# Patient Record
Sex: Female | Born: 1960 | Race: White | Hispanic: No | Marital: Married | Smoking: Never smoker
Health system: Southern US, Community
[De-identification: ages and names within clinical notes are randomized; demographics above are authoritative.]

## PROBLEM LIST (undated history)

## (undated) DIAGNOSIS — R51 Headache: Secondary | ICD-10-CM

## (undated) DIAGNOSIS — S0300XA Dislocation of jaw, unspecified side, initial encounter: Secondary | ICD-10-CM

## (undated) DIAGNOSIS — J45909 Unspecified asthma, uncomplicated: Secondary | ICD-10-CM

## (undated) DIAGNOSIS — R519 Headache, unspecified: Secondary | ICD-10-CM

## (undated) HISTORY — DX: Dislocation of jaw, unspecified side, initial encounter: S03.00XA

## (undated) HISTORY — DX: Headache, unspecified: R51.9

## (undated) HISTORY — PX: ABDOMINAL HYSTERECTOMY: SHX81

## (undated) HISTORY — DX: Headache: R51

---

## 1988-11-14 HISTORY — PX: GANGLION CYST EXCISION: SHX1691

## 1999-08-19 ENCOUNTER — Other Ambulatory Visit: Admission: RE | Admit: 1999-08-19 | Discharge: 1999-08-19 | Payer: Self-pay | Admitting: Family Medicine

## 2000-02-07 ENCOUNTER — Encounter: Payer: Self-pay | Admitting: Family Medicine

## 2000-02-07 ENCOUNTER — Encounter: Admission: RE | Admit: 2000-02-07 | Discharge: 2000-02-07 | Payer: Self-pay | Admitting: Family Medicine

## 2000-02-17 ENCOUNTER — Encounter: Payer: Self-pay | Admitting: Family Medicine

## 2000-02-17 ENCOUNTER — Encounter: Admission: RE | Admit: 2000-02-17 | Discharge: 2000-02-17 | Payer: Self-pay | Admitting: Family Medicine

## 2000-10-02 ENCOUNTER — Encounter (INDEPENDENT_AMBULATORY_CARE_PROVIDER_SITE_OTHER): Payer: Self-pay | Admitting: Specialist

## 2000-10-02 ENCOUNTER — Ambulatory Visit (HOSPITAL_COMMUNITY): Admission: RE | Admit: 2000-10-02 | Discharge: 2000-10-02 | Payer: Self-pay | Admitting: Gastroenterology

## 2001-04-17 ENCOUNTER — Encounter: Payer: Self-pay | Admitting: Family Medicine

## 2001-04-17 ENCOUNTER — Encounter: Admission: RE | Admit: 2001-04-17 | Discharge: 2001-04-17 | Payer: Self-pay | Admitting: Family Medicine

## 2001-07-17 ENCOUNTER — Other Ambulatory Visit: Admission: RE | Admit: 2001-07-17 | Discharge: 2001-07-17 | Payer: Self-pay | Admitting: *Deleted

## 2002-04-26 ENCOUNTER — Encounter: Admission: RE | Admit: 2002-04-26 | Discharge: 2002-04-26 | Payer: Self-pay | Admitting: Family Medicine

## 2002-04-26 ENCOUNTER — Encounter: Payer: Self-pay | Admitting: Family Medicine

## 2003-05-06 ENCOUNTER — Encounter: Payer: Self-pay | Admitting: Family Medicine

## 2003-05-06 ENCOUNTER — Encounter: Admission: RE | Admit: 2003-05-06 | Discharge: 2003-05-06 | Payer: Self-pay | Admitting: Family Medicine

## 2004-04-02 ENCOUNTER — Other Ambulatory Visit: Admission: RE | Admit: 2004-04-02 | Discharge: 2004-04-02 | Payer: Self-pay | Admitting: Family Medicine

## 2005-04-22 ENCOUNTER — Other Ambulatory Visit: Admission: RE | Admit: 2005-04-22 | Discharge: 2005-04-22 | Payer: Self-pay | Admitting: Family Medicine

## 2005-05-24 ENCOUNTER — Encounter: Admission: RE | Admit: 2005-05-24 | Discharge: 2005-05-24 | Payer: Self-pay | Admitting: Family Medicine

## 2006-04-28 ENCOUNTER — Other Ambulatory Visit: Admission: RE | Admit: 2006-04-28 | Discharge: 2006-04-28 | Payer: Self-pay | Admitting: Family Medicine

## 2006-09-26 ENCOUNTER — Encounter: Admission: RE | Admit: 2006-09-26 | Discharge: 2006-09-26 | Payer: Self-pay | Admitting: Family Medicine

## 2007-05-02 ENCOUNTER — Other Ambulatory Visit: Admission: RE | Admit: 2007-05-02 | Discharge: 2007-05-02 | Payer: Self-pay | Admitting: Family Medicine

## 2007-08-06 ENCOUNTER — Other Ambulatory Visit: Admission: RE | Admit: 2007-08-06 | Discharge: 2007-08-06 | Payer: Self-pay | Admitting: Family Medicine

## 2007-10-01 ENCOUNTER — Encounter: Admission: RE | Admit: 2007-10-01 | Discharge: 2007-10-01 | Payer: Self-pay | Admitting: Family Medicine

## 2007-12-12 ENCOUNTER — Other Ambulatory Visit: Admission: RE | Admit: 2007-12-12 | Discharge: 2007-12-12 | Payer: Self-pay | Admitting: Family Medicine

## 2008-10-14 ENCOUNTER — Encounter: Admission: RE | Admit: 2008-10-14 | Discharge: 2008-10-14 | Payer: Self-pay | Admitting: Family Medicine

## 2008-12-17 ENCOUNTER — Other Ambulatory Visit: Admission: RE | Admit: 2008-12-17 | Discharge: 2008-12-17 | Payer: Self-pay | Admitting: Family Medicine

## 2009-11-24 ENCOUNTER — Encounter: Admission: RE | Admit: 2009-11-24 | Discharge: 2009-11-24 | Payer: Self-pay | Admitting: Family Medicine

## 2010-06-17 ENCOUNTER — Other Ambulatory Visit: Admission: RE | Admit: 2010-06-17 | Discharge: 2010-06-17 | Payer: Self-pay | Admitting: Family Medicine

## 2010-11-25 ENCOUNTER — Encounter
Admission: RE | Admit: 2010-11-25 | Discharge: 2010-11-25 | Payer: Self-pay | Source: Home / Self Care | Attending: Family Medicine | Admitting: Family Medicine

## 2011-02-14 ENCOUNTER — Ambulatory Visit
Admission: RE | Admit: 2011-02-14 | Discharge: 2011-02-14 | Disposition: A | Discharge status: Home / Self Care | Payer: 59 | Source: Ambulatory Visit | Attending: Family Medicine | Admitting: Family Medicine

## 2011-02-14 ENCOUNTER — Other Ambulatory Visit: Payer: Self-pay | Admitting: Family Medicine

## 2011-02-14 DIAGNOSIS — R0989 Other specified symptoms and signs involving the circulatory and respiratory systems: Secondary | ICD-10-CM

## 2011-02-14 DIAGNOSIS — R05 Cough: Secondary | ICD-10-CM

## 2011-04-01 NOTE — Procedures (Signed)
[place]. [facility]  Patient:    Alexa Beard, Alexa Beard                      MRN: [id number] Proc. Date: 10/02/00 Adm. Date:  [id number] Attending:  Rich Brave CC:         Dyanne Carrel, M.D.  Thad Ranger, M.D   Procedure Report  PROCEDURE:  Colonoscopy with biopsies.  INDICATION:  A 50 year old female with intermittent postprandial diarrhea and family history of colon polyps in her father.  FINDINGS:  Normal exam to the terminal ileum except for diminutive sigmoid polyp.  DESCRIPTION OF PROCEDURE:  The nature, purpose, and risks of the procedure had been discussed with the patient, who provided written consent.  The Olympus adjustable-tension video colonoscope was advanced to the terminal ileum without significant difficulty, turning the patient into the supine position to facilitate passage.  The terminal ileum had a normal appearance and was biopsied a few times. Pullback was then performed.  The quality of the prep was excellent, and it was felt that all areas were well seen.  There was a diminutive 2 mm hyperplastic-appearing polyp at 35 cm, which I biopsied x 1.  Random mucosal biopsies were also obtained along the length of the colon. There was perhaps some slight edema of the distal rectal mucosa with increased friability, but this is felt probably to be more prep-related.  There was no overt granularity or exudate noted.  Retroflexion in the rectum prior to the rectal biopsies was unremarkable.  The patient tolerated this procedure well, and there were no apparent complications.  IMPRESSION: 1. Essentially normal colonoscopy, without source of postprandial diarrhea    endoscopically evident. 2. Tiny sigmoid polyp.  PLAN:  Await pathology on biopsies. DD:  10/02/00 TD:  10/02/00 Job: [id number]

## 2011-04-01 NOTE — Procedures (Signed)
[place]. [facility]  Patient:    Alexa Beard, Alexa Beard                      MRN: [id number] Proc. Date: 10/02/00 Adm. Date:  [id number] Attending:  Rich Brave CC:         Dyanne Carrel, M.D.  Thad Ranger, M.D   Procedure Report  PROCEDURE:  Upper endoscopy with biopsies.  INDICATION:  This is a 50 year old female with laryngopharyngeal reflux-type symptoms and also intermittent diarrhea.  FINDINGS:  Possible "scalloping" of duodenal rings, suggesting possible celiac disease.  Otherwise unremarkable exam.  DESCRIPTION OF PROCEDURE:  The nature, purpose, and risks of the procedure had been discussed with the patient, who provided written consent.  Sedation was fentanyl 80 mcg and Versed 8 mg IV without arrhythmias or desaturation.  The Olympus XQ-140 video small-caliber upper endoscope was passed under direct vision.  Careful exam of the vocal cords and larynx did not disclose any obvious or significant reflux-induced changes, in my opinion.  There might have been some slight thickening of the posterior commissure in the inter-arytenoid area.  The esophagus was entered under direct vision without difficulty and had entirely normal mucosa, without evidence of reflux esophagitis, Barretts esophagus, varices, infection, or neoplasia.  No ring, stricture, or definite hiatal hernia was appreciated.  The stomach was entered.  Retroflex viewing showed at most just a very minimal hiatal hernia.  There was no significant residual, and the gastric mucosa was normal, without evidence of gastritis, erosions, ulcers, polyps, or masses.  The pylorus and duodenal bulb looked normal, but the second portion of the duodenum had some slight scalloping of the duodenal rings, the significance of which is unclear, but this can be a finding in celiac disease, so, in view of the patients history of postprandial diarrhea, several biopsies were  obtained from the duodenum prior to removal of the scope.  The patient tolerated this procedure well, and there were no apparent complications.  IMPRESSION: 1. No obvious reflux-related changes. 2. Possible duodenal abnormality, pathology pending.  PLAN: 1. Await pathology on biopsies. 2. Consider 24-hour pH monitoring and/or ENT evaluation for further evaluation    of possible laryngopharyngeal reflux. 3. Continue intensive PPI therapy for now. DD:  10/02/00 TD:  10/02/00 Job: [id number]

## 2011-06-22 ENCOUNTER — Other Ambulatory Visit (HOSPITAL_COMMUNITY)
Admission: RE | Admit: 2011-06-22 | Discharge: 2011-06-22 | Disposition: A | Discharge status: Home / Self Care | Payer: 59 | Source: Ambulatory Visit | Attending: Family Medicine | Admitting: Family Medicine

## 2011-06-22 ENCOUNTER — Other Ambulatory Visit: Payer: Self-pay | Admitting: Family Medicine

## 2011-06-22 DIAGNOSIS — Z124 Encounter for screening for malignant neoplasm of cervix: Secondary | ICD-10-CM | POA: Insufficient documentation

## 2011-11-16 ENCOUNTER — Other Ambulatory Visit: Payer: Self-pay | Admitting: Family Medicine

## 2011-11-16 DIAGNOSIS — Z1231 Encounter for screening mammogram for malignant neoplasm of breast: Secondary | ICD-10-CM

## 2011-11-28 ENCOUNTER — Ambulatory Visit
Admission: RE | Admit: 2011-11-28 | Discharge: 2011-11-28 | Disposition: A | Discharge status: Home / Self Care | Payer: 59 | Source: Ambulatory Visit | Attending: Family Medicine | Admitting: Family Medicine

## 2011-11-28 DIAGNOSIS — Z1231 Encounter for screening mammogram for malignant neoplasm of breast: Secondary | ICD-10-CM

## 2012-07-20 ENCOUNTER — Other Ambulatory Visit: Payer: Self-pay | Admitting: Family Medicine

## 2012-07-20 DIAGNOSIS — R2681 Unsteadiness on feet: Secondary | ICD-10-CM

## 2012-07-20 DIAGNOSIS — R209 Unspecified disturbances of skin sensation: Secondary | ICD-10-CM

## 2012-07-20 DIAGNOSIS — R51 Headache: Secondary | ICD-10-CM

## 2012-07-23 ENCOUNTER — Other Ambulatory Visit: Payer: 59

## 2012-07-25 ENCOUNTER — Ambulatory Visit
Admission: RE | Admit: 2012-07-25 | Discharge: 2012-07-25 | Disposition: A | Discharge status: Home / Self Care | Payer: 59 | Source: Ambulatory Visit | Attending: Family Medicine | Admitting: Family Medicine

## 2012-07-25 DIAGNOSIS — R2681 Unsteadiness on feet: Secondary | ICD-10-CM

## 2012-07-25 DIAGNOSIS — R209 Unspecified disturbances of skin sensation: Secondary | ICD-10-CM

## 2012-07-25 DIAGNOSIS — R51 Headache: Secondary | ICD-10-CM

## 2012-07-25 MED ORDER — GADOBENATE DIMEGLUMINE 529 MG/ML IV SOLN
14.0000 mL | Freq: Once | INTRAVENOUS | Status: AC | PRN
  Administered 2012-07-25: 14 mL via INTRAVENOUS

## 2012-10-19 ENCOUNTER — Other Ambulatory Visit: Payer: Self-pay | Admitting: Family Medicine

## 2012-10-19 DIAGNOSIS — Z1231 Encounter for screening mammogram for malignant neoplasm of breast: Secondary | ICD-10-CM

## 2012-11-28 ENCOUNTER — Ambulatory Visit
Admission: RE | Admit: 2012-11-28 | Discharge: 2012-11-28 | Disposition: A | Discharge status: Home / Self Care | Payer: 59 | Source: Ambulatory Visit | Attending: Family Medicine | Admitting: Family Medicine

## 2012-11-28 DIAGNOSIS — Z1231 Encounter for screening mammogram for malignant neoplasm of breast: Secondary | ICD-10-CM

## 2013-04-10 ENCOUNTER — Other Ambulatory Visit: Payer: Self-pay | Admitting: Diagnostic Neuroimaging

## 2013-05-29 ENCOUNTER — Ambulatory Visit: Payer: Self-pay | Admitting: Diagnostic Neuroimaging

## 2013-06-03 ENCOUNTER — Ambulatory Visit: Payer: Self-pay | Admitting: Nurse Practitioner

## 2013-06-05 ENCOUNTER — Ambulatory Visit (INDEPENDENT_AMBULATORY_CARE_PROVIDER_SITE_OTHER): Payer: 59 | Admitting: Nurse Practitioner

## 2013-06-05 ENCOUNTER — Encounter: Payer: Self-pay | Admitting: Nurse Practitioner

## 2013-06-05 VITALS — BP 142/79 | HR 60 | Temp 98.5°F | Ht 62.75 in | Wt 161.0 lb

## 2013-06-05 DIAGNOSIS — G43909 Migraine, unspecified, not intractable, without status migrainosus: Secondary | ICD-10-CM

## 2013-06-05 MED ORDER — SUMATRIPTAN SUCCINATE 100 MG PO TABS: 100.0000 mg | ORAL_TABLET | Freq: Every day | ORAL | Status: DC

## 2013-06-05 MED ORDER — PROPRANOLOL HCL ER 60 MG PO CP24: ORAL_CAPSULE | ORAL | Status: DC

## 2013-06-05 NOTE — Progress Notes (Signed)
[facility]  PATIENT: Alexa Beard DOB: 1961-07-13   HISTORY FROM: patient REASON FOR VISIT: 6 month follow up for Migraine  HISTORY OF PRESENT ILLNESS:  Update 51/23/2014: Since last visit, no changes, still having 1 migraine-type headache per month with approximately 4 milder type stress headaches per month.  She states her migraines are sometimes accompanied with aura of spots in her vision and increased sensitivity to light. She cannot identify any headache triggers except stress. She continues to take propranolol daily and Imitrex when necessary for acute headache.  For milder stress-type headache she takes extra strength Tylenol.  Still with intermittent numbness, confusion, short-term memory problems and word finding difficulties.  Update 11/27/2012: Since last visit, now having one headache per month. Much improved. Still with intermittent numbness, confusion, memory. Some intermittent left neck and trapezius pain.  PRIOR HPI (VRP): 08/23/2012: 52 year old left-handed female with history of migraine, anxiety, here for evaluation of new onset headaches and abnormal MRI.  Patient has long history of migraine headaches typically with unilateral throbbing severe pain, seeing spots or blind spots, with nausea and sensitivity light and sound. Previously she was taking Imitrex as needed for migraine control. Over the past one year she developed a new type of headache, global sudden severe headaches with intermittent nausea and vision changes. For the past 6 months she's had intermittent numbness in her bilateral fingertips, dropping things. Showed similar symptoms 10 years ago, diagnosed by Dr. love, with MRI of the cervical spine and EMG. 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 Patient also complains of mild intermittent progressive short-term memory loss, word finding difficulties. She also reports poor sleep. Husband reports mild snoring. She is restless  with mild anxiety.  REVIEW OF SYSTEMS: Full 14 system review of systems performed and notable only for: constitutional: Fatigue  cardiovascular: N/A respiratory: Snoring endocrine: Feeling hot ear/nose/throat: Ringing in ears, spinning sensation, trouble swallowing  musculoskeletal: Joint pain, aching muscles skin: N/A genitourinary: N/A Gastrointestinal: Blood in stool, diarrhea (last colonoscopy November 2013) allergy/immunology: Allergies neurological: Memory loss, confusion, headache, numbness, weakness, difficulty swallowing, dizziness sleep: Insomnia, snoring, restless legs psychiatric: Anxiety, decreased energy, racing thoughts   ALLERGIES: Allergies  Allergen Reactions  . Ampicillin Hives    HOME MEDICATIONS: Outpatient Prescriptions Prior to Visit  Medication Sig Dispense Refill  . propranolol ER (INDERAL LA) 60 MG 24 hr capsule TAKE 1 CAPSULE BY MOUTH DAILY  90 capsule  1   No facility-administered medications prior to visit.    PAST MEDICAL HISTORY: No past medical history on file.  PAST SURGICAL HISTORY: No past surgical history on file.  FAMILY HISTORY: No family history on file.  SOCIAL HISTORY: History   Social History  . Marital Status: Married    Spouse Name: N/A    Number of Children: N/A  . Years of Education: N/A   Occupational History  . Not on file.   Social History Main Topics  . Smoking status: Not on file  . Smokeless tobacco: Not on file  . Alcohol Use: Not on file  . Drug Use: Not on file  . Sexually Active: Not on file   Other Topics Concern  . Not on file   Social History Narrative  . No narrative on file     PHYSICAL EXAM  Filed Vitals:   06/05/13 1522  BP: 142/79  Pulse: 60  Temp: 98.5 F (36.9 C)  TempSrc: Oral  Weight: 161 lb (73.029 kg)   There is no  height on file to calculate BMI.  Generalized: In no acute distress, well-developed, well-nourished Caucasian  Female.  Neck: Supple, no carotid bruits     Cardiac: Regular rate rhythm, no murmur   Pulmonary: Clear to auscultation bilaterally   Musculoskeletal: No deformity   Neurological examination   Mentation: Alert oriented to time, place, history taking, language fluent, and causual conversation  Cranial nerve II-XII: Pupils were equal round reactive to light extraocular movements were full, visual field were full on confrontational test. facial sensation and strength were normal. hearing was intact to finger rubbing bilaterally. Uvula tongue midline. head turning and shoulder shrug and were normal and symmetric.Tongue protrusion into cheek strength was normal. MOTOR: normal bulk and tone, full strength in the BUE, BLE, fine finger movements normal, no pronator drift SENSORY: normal and symmetric to light touch, pinprick, temperature, vibration and proprioception COORDINATION: finger-nose-finger, heel-to-shin bilaterally, there was no truncal ataxia REFLEXES: Brachioradialis 2/2, biceps 2/2, triceps 2/2, patellar 2/2, Achilles 2/2, plantar responses were flexor bilaterally. GAIT/STATION: Rising up from seated position without assistance, normal stance, without trunk ataxia, moderate stride, good arm swing, smooth turning, able to perform tiptoe, and heel walking without difficulty.    DIAGNOSTIC DATA (LABS, IMAGING, TESTING) - I reviewed patient records, labs, notes, testing and imaging myself where available.  07/25/12 MRI brain: few non-specific foci of gliosis.  ASSESSMENT AND PLAN 52 year old Caucasian female with history of anxiety and Migraines here for routine follow up of Migraine headaches.  PLAN: 1. Continue Sumatriptan Succinate 100 mg tabs prn 2. Continue Propranolol HCl ER 60 mg daily. 3. Headache education reviewed (moderation with etoh, sleep, caffeine, and eating.) 4. Follow up in 6 months.   Hazael Olveda NP-C 06/05/2013, 3:26 PM  [facility] [address] [place], [place]  [address] [phone]

## 2013-06-05 NOTE — Patient Instructions (Addendum)
Continue current medications    Follow up in 6 months

## 2013-06-12 NOTE — Progress Notes (Signed)
I reviewed note and agree with plan.   Suanne Marker, MD 06/12/2013, 6:33 PM Certified in Neurology, Neurophysiology and Neuroimaging  [facility] [address] [place], [place] [address] [phone]

## 2013-10-29 ENCOUNTER — Other Ambulatory Visit: Payer: Self-pay

## 2013-10-29 DIAGNOSIS — Z1231 Encounter for screening mammogram for malignant neoplasm of breast: Secondary | ICD-10-CM

## 2013-12-03 ENCOUNTER — Ambulatory Visit: Payer: 59

## 2013-12-05 ENCOUNTER — Encounter: Payer: Self-pay | Admitting: Nurse Practitioner

## 2013-12-05 ENCOUNTER — Ambulatory Visit (INDEPENDENT_AMBULATORY_CARE_PROVIDER_SITE_OTHER): Payer: 59 | Admitting: Nurse Practitioner

## 2013-12-05 ENCOUNTER — Encounter (INDEPENDENT_AMBULATORY_CARE_PROVIDER_SITE_OTHER): Payer: Self-pay

## 2013-12-05 VITALS — BP 142/82 | HR 74 | Ht 62.75 in | Wt 167.0 lb

## 2013-12-05 DIAGNOSIS — G43909 Migraine, unspecified, not intractable, without status migrainosus: Secondary | ICD-10-CM

## 2013-12-05 MED ORDER — TOPIRAMATE 50 MG PO TABS: 50.0000 mg | ORAL_TABLET | Freq: Every day | ORAL | Status: DC

## 2013-12-05 MED ORDER — SUMATRIPTAN SUCCINATE 100 MG PO TABS: 100.0000 mg | ORAL_TABLET | Freq: Every day | ORAL | Status: DC

## 2013-12-05 NOTE — Patient Instructions (Addendum)
  PLAN:  1. Continue Sumatriptan Succinate 100 mg tabs prn  2. DiscontinuePropranolol HCl ER 60 mg daily.  3. Start Topirimate 50 mg, take 1/2 tablet for 1 week, then increase to 1 tablet every night.  4. Follow up in 3 months.

## 2013-12-05 NOTE — Progress Notes (Signed)
PATIENT: Alexa Beard DOB: 02-03-1961   REASON FOR VISIT: follow up for Migraine HISTORY FROM: patient  HISTORY OF PRESENT ILLNESS: UPDATE 12/05/13 (LL):  Returns for follow up of Migraine, still having only 1-2 Migraines per month but is concerned over fatigue with Inderal LA.  Would like to try something else as preventative.  Update 06/05/2013 (LL): Since last visit, no changes, still having 1 migraine-type headache per month with approximately 4 milder type stress headaches per month. She states her migraines are sometimes accompanied with aura of spots in her vision and increased sensitivity to light. She cannot identify any headache triggers except stress. She continues to take propranolol daily and Imitrex when necessary for acute headache. For milder stress-type headache she takes extra strength Tylenol. Still with intermittent numbness, confusion, short-term memory problems and word finding difficulties.   Update 11/27/2012 (VRP): Since last visit, now having one headache per month. Much improved. Still with intermittent numbness, confusion, memory. Some intermittent left neck and trapezius pain.   PRIOR HPI (VRP): 08/23/2012: 53 year old left-handed female with history of migraine, anxiety, here for evaluation of new onset headaches and abnormal MRI.  Patient has long history of migraine headaches typically with unilateral throbbing severe pain, seeing spots or blind spots, with nausea and sensitivity light and sound. Previously she was taking Imitrex as needed for migraine control. Over the past one year she developed a new type of headache, global sudden severe headaches with intermittent nausea and vision changes. For the past 6 months she's had intermittent numbness in her bilateral fingertips, dropping things. Showed similar symptoms 10 years ago, diagnosed by Dr. love, with MRI of the cervical spine and EMG. 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 Patient also  complains of mild intermittent progressive short-term memory loss, word finding difficulties. She also reports poor sleep. Husband reports mild snoring. She is restless with mild anxiety.   REVIEW OF SYSTEMS: Full 14 system review of systems performed and notable only for:  constitutional: Fatigue  endocrine: Excessive thirst ear/nose/throat: Ringing in ears, neck pain, trouble swallowing  musculoskeletal: coordination problems   Gastrointestinal: Blood in stool, diarrhea (last colonoscopy November 2013)  neurological: Memory loss, headache, numbness, dizziness  sleep:snoring, daytime sleepiness Psychiatric: decreased concentration  ALLERGIES: Allergies  Allergen Reactions  . Ampicillin Hives  . Benzonatate Itching    HOME MEDICATIONS: Outpatient Prescriptions Prior to Visit  Medication Sig Dispense Refill  . ADVAIR DISKUS 250-50 MCG/DOSE AEPB Inhale 1 puff into the lungs as needed.      . diphenoxylate-atropine (LOMOTIL) 2.5-0.025 MG per tablet Take 1 tablet by mouth as needed for diarrhea or loose stools.      Marland Kitchen esomeprazole (NEXIUM) 10 MG packet Take 10 mg by mouth as needed.      Marland Kitchen FLUoxetine (PROZAC) 10 MG capsule Take 10 mg by mouth daily.      . montelukast (SINGULAIR) 10 MG tablet Take 1 tablet by mouth daily.      . temazepam (RESTORIL) 15 MG capsule Take 15 mg by mouth at bedtime.      . VENTOLIN HFA 108 (90 BASE) MCG/ACT inhaler Inhale 1 puff into the lungs as needed.      . propranolol ER (INDERAL LA) 60 MG 24 hr capsule TAKE 1 CAPSULE BY MOUTH DAILY  90 capsule  3  . SUMAtriptan (IMITREX) 100 MG tablet Take 1 tablet (100 mg total) by mouth daily.  10 tablet  6   No facility-administered medications prior to  visit.    PAST MEDICAL HISTORY: History reviewed. No pertinent past medical history.  PAST SURGICAL HISTORY: History reviewed. No pertinent past surgical history.  FAMILY HISTORY: Family History  Problem Relation Age of Onset  . COPD Father   . Dementia  Father     SOCIAL HISTORY: History   Social History  . Marital Status: Married    Spouse Name: Ortencia KickByron    Number of Children: 0  . Years of Education: HS   Occupational History  .      [employer]   Social History Main Topics  . Smoking status: Never Smoker   . Smokeless tobacco: Never Used  . Alcohol Use: 5 - 10 oz/week    10-20 drink(s) per week  . Drug Use: No  . Sexual Activity: Yes    Partners: Female   Other Topics Concern  . Not on file   Social History Narrative   Patient lives at home with family.   Caffeine Use: 1 cup daily     PHYSICAL EXAM  Filed Vitals:   12/05/13 1537  BP: 142/82  Pulse: 74  Height: 5' 2.75" (1.594 m)  Weight: 167 lb (75.751 kg)   Body mass index is 29.81 kg/(m^2).  Generalized: In no acute distress, well-developed, well-nourished Caucasian Female.  Neck: Supple, no carotid bruits  Cardiac: Regular rate rhythm, no murmur  Pulmonary: Clear to auscultation bilaterally  Musculoskeletal: No deformity   Neurological examination  Mentation: Alert oriented to time, place, history taking, language fluent, and causual conversation  Cranial nerve II-XII: Pupils were equal round reactive to light extraocular movements were full, visual field were full on confrontational test. facial sensation and strength were normal. hearing was intact to finger rubbing bilaterally. Uvula tongue midline. head turning and shoulder shrug and were normal and symmetric.Tongue protrusion into cheek strength was normal.  MOTOR: normal bulk and tone, full strength in the BUE, BLE, fine finger movements normal, no pronator drift  SENSORY: normal and symmetric to light touch, pinprick, temperature, vibration and proprioception  COORDINATION: finger-nose-finger, heel-to-shin bilaterally, there was no truncal ataxia  REFLEXES: Brachioradialis 2/2, biceps 2/2, triceps 2/2, patellar 2/2, Achilles 2/2, plantar responses were flexor bilaterally.  GAIT/STATION:  Rising up from seated position without assistance, normal stance, without trunk ataxia, moderate stride, good arm swing, smooth turning, able to perform tiptoe, and heel walking without difficulty.   DIAGNOSTIC DATA (LABS, IMAGING, TESTING) - I reviewed patient records, labs, notes, testing and imaging myself where available. 07/25/12 MRI brain: few non-specific foci of gliosis.   ASSESSMENT AND PLAN  53 year old Caucasian female with history of anxiety and Migraines here for routine follow up of Migraine headaches.  Migraines are stable, 1-2 per month, concerned about fatigue with Inderal.  PLAN:  1. Continue Sumatriptan Succinate 100 mg tabs prn  2. Discontinue Propranolol HCl ER 60 mg daily.  3. Start Topirimate 50 mg, take 1/2 tablet for 1 week, then increase to 1 tablet every night.  4. Follow up in 3 months.   No orders of the defined types were placed in this encounter.    Meds ordered this encounter  Medications  . SUMAtriptan (IMITREX) 100 MG tablet    Sig: Take 1 tablet (100 mg total) by mouth daily.    Dispense:  12 tablet    Refill:  5    Order Specific Question:  Supervising Provider    Answer:  Joycelyn SchmidPENUMALLI, VIKRAM R [[id number]]  . topiramate (TOPAMAX) 50 MG tablet  Sig: Take 1 tablet (50 mg total) by mouth daily.    Dispense:  30 tablet    Refill:  3    Order Specific Question:  Supervising Provider    Answer:  Suanne Marker [[id number]]   Return in about 3 months (around 03/05/2014).  Ronal Fear, MSN, NP-C 12/05/2013, 4:03 PM [facility] [address], [address] [place], [place] [address] [phone]  Note: This document was prepared with digital dictation and possible smart phrase technology. Any transcriptional errors that result from this process are unintentional.

## 2013-12-18 ENCOUNTER — Ambulatory Visit
Admission: RE | Admit: 2013-12-18 | Discharge: 2013-12-18 | Disposition: A | Discharge status: Home / Self Care | Payer: 59 | Source: Ambulatory Visit

## 2013-12-18 DIAGNOSIS — Z1231 Encounter for screening mammogram for malignant neoplasm of breast: Secondary | ICD-10-CM

## 2013-12-20 ENCOUNTER — Other Ambulatory Visit: Payer: Self-pay | Admitting: Family Medicine

## 2013-12-20 DIAGNOSIS — R928 Other abnormal and inconclusive findings on diagnostic imaging of breast: Secondary | ICD-10-CM

## 2013-12-27 ENCOUNTER — Ambulatory Visit
Admission: RE | Admit: 2013-12-27 | Discharge: 2013-12-27 | Disposition: A | Discharge status: Home / Self Care | Payer: 59 | Source: Ambulatory Visit | Attending: Family Medicine | Admitting: Family Medicine

## 2013-12-27 DIAGNOSIS — R928 Other abnormal and inconclusive findings on diagnostic imaging of breast: Secondary | ICD-10-CM

## 2014-01-13 IMAGING — MG MM DIGITAL SCREENING BILAT
4 series · 4 of 4 positions shown · non-contrast
Comparison: none

DG SCREEN MAMMOGRAM BILATERAL
Bilateral CC and MLO view(s) were taken.

DIGITAL SCREENING MAMMOGRAM WITH CAD:
There are scattered fibroglandular densities.  No masses or malignant type calcifications are 
identified.  Compared with prior studies.
Images were processed with CAD.

[R CC]
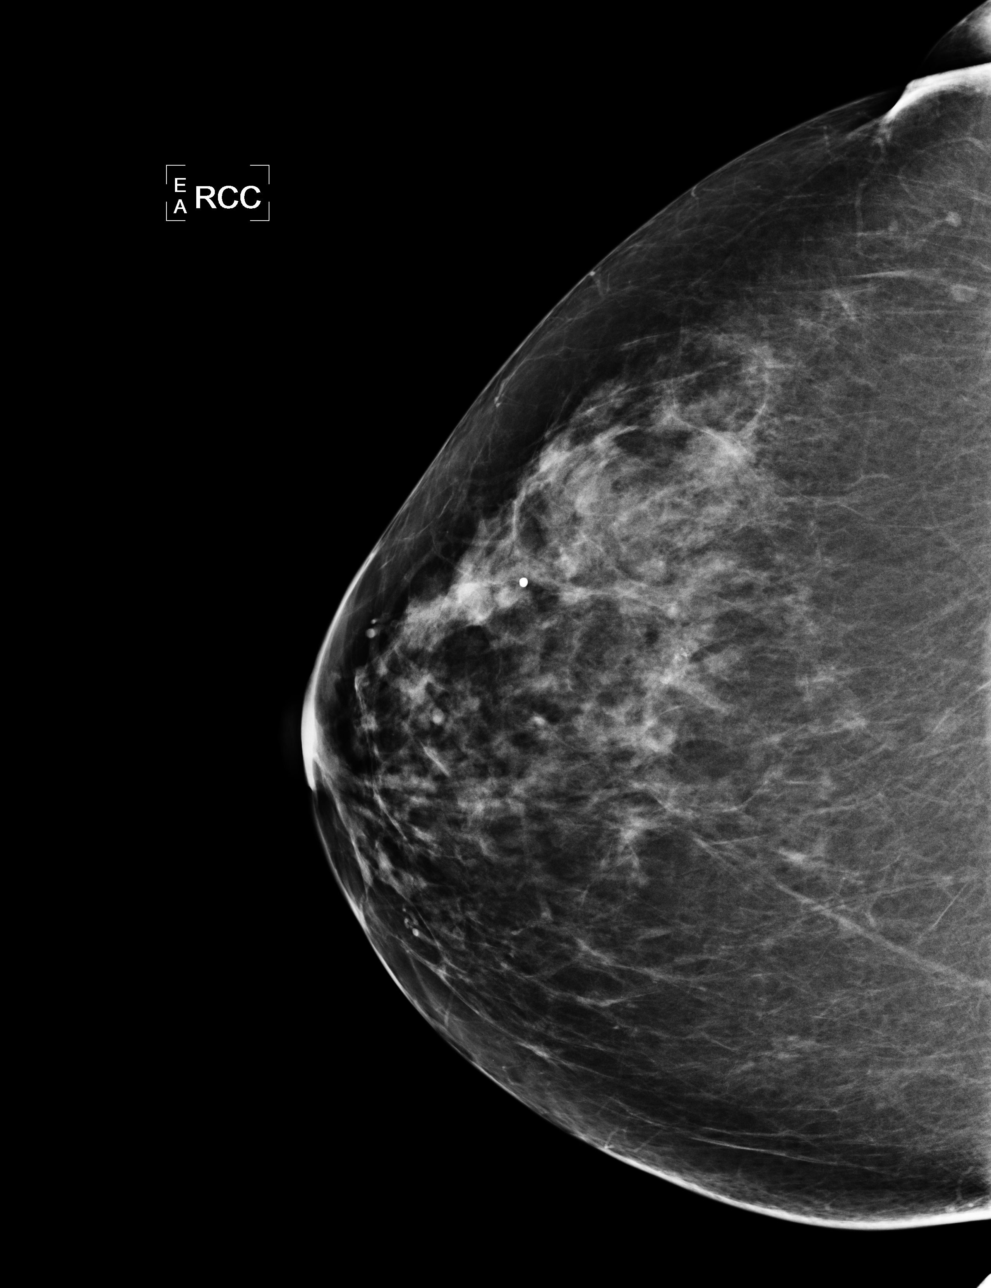

[L CC]
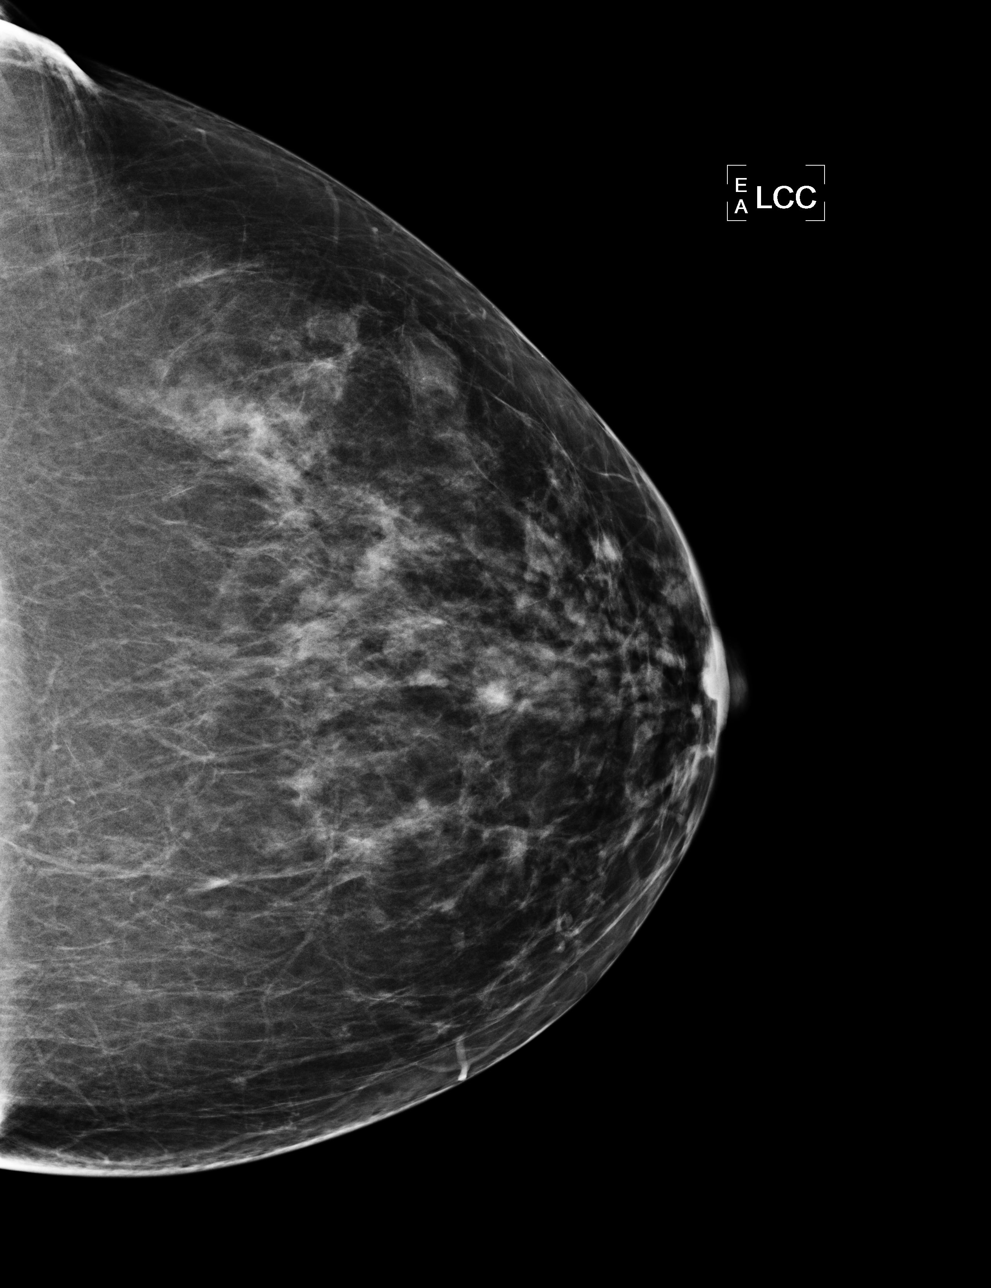

[L MLO]
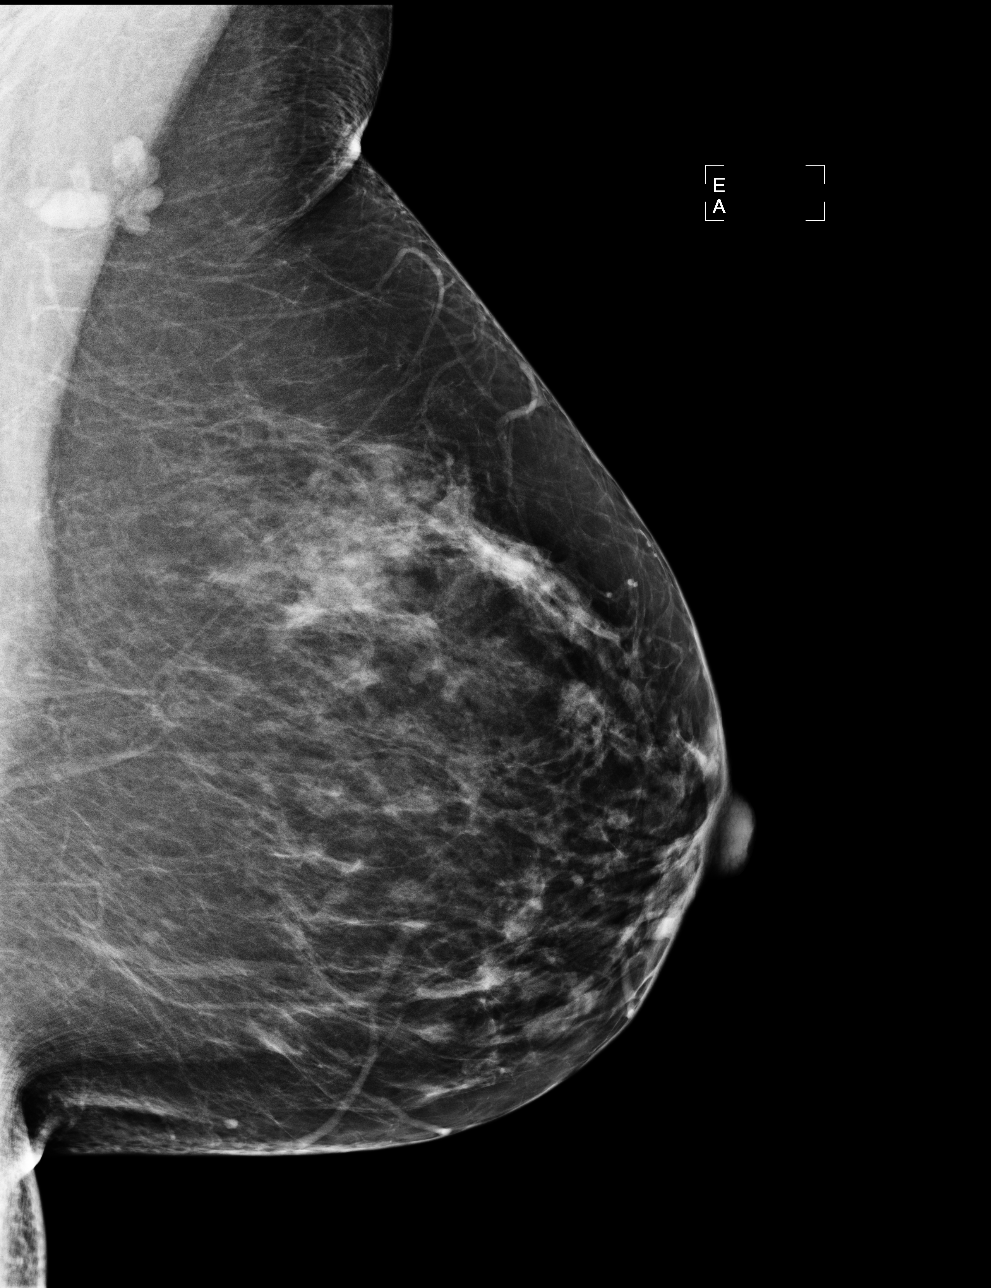

[R MLO]
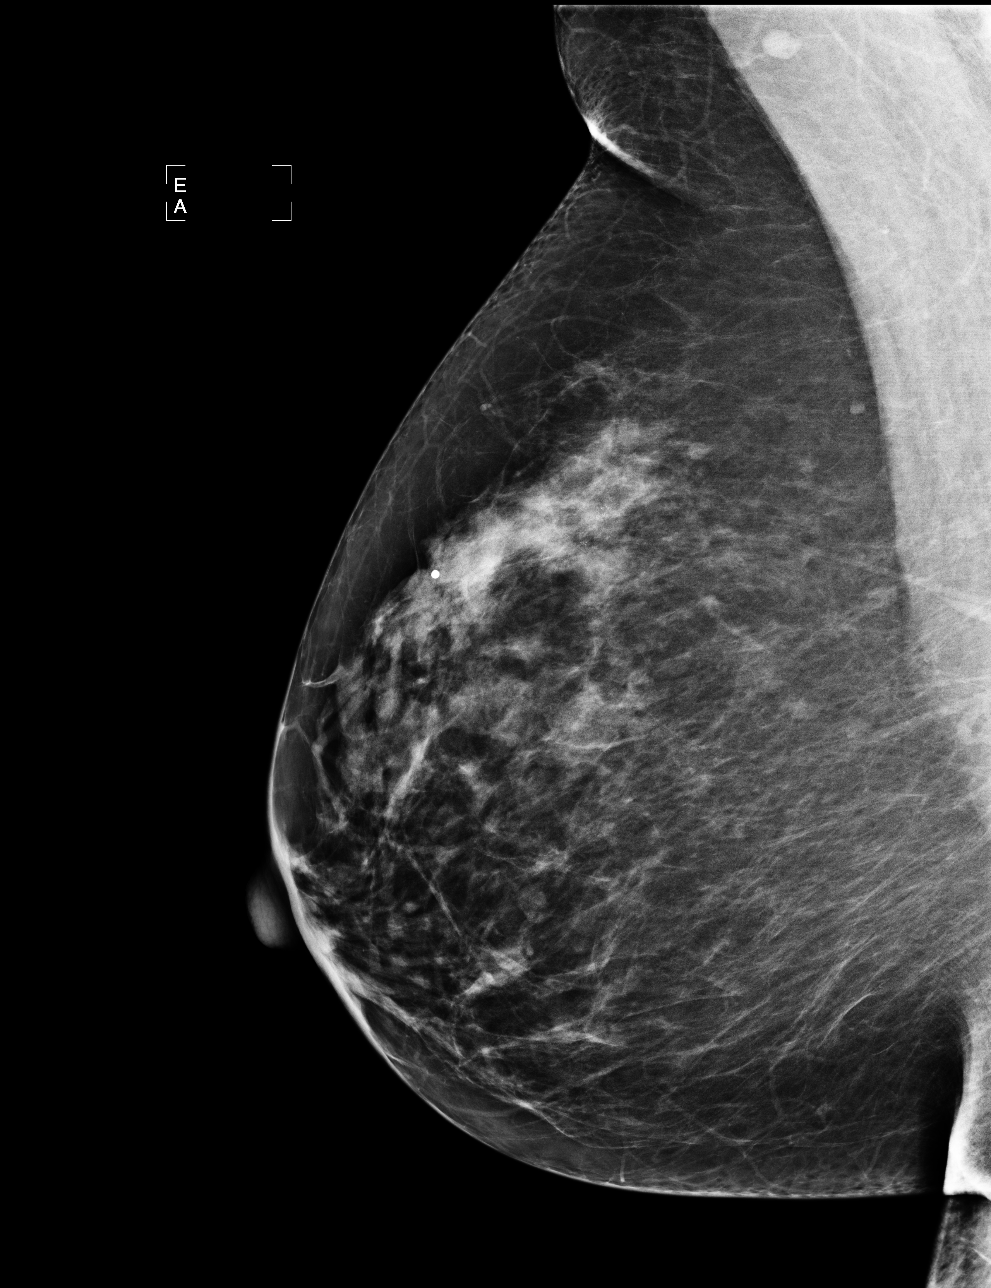

[4 of 4 positions shown; findings below may reference images not displayed]

IMPRESSION: No specific mammographic evidence of malignancy.  Next screening mammogram is recommended in one 
year.

A result letter of this screening mammogram will be mailed directly to the patient.

ASSESSMENT: Negative - BI-RADS 1

Screening mammogram in 1 year.
,

## 2014-01-22 ENCOUNTER — Other Ambulatory Visit: Payer: Self-pay

## 2014-01-22 MED ORDER — TOPIRAMATE 50 MG PO TABS: 50.0000 mg | ORAL_TABLET | Freq: Every day | ORAL | Status: DC

## 2014-03-10 ENCOUNTER — Ambulatory Visit: Payer: 59 | Admitting: Diagnostic Neuroimaging

## 2014-04-30 ENCOUNTER — Encounter: Payer: Self-pay | Admitting: Diagnostic Neuroimaging

## 2014-04-30 ENCOUNTER — Encounter (INDEPENDENT_AMBULATORY_CARE_PROVIDER_SITE_OTHER): Payer: Self-pay

## 2014-04-30 ENCOUNTER — Ambulatory Visit (INDEPENDENT_AMBULATORY_CARE_PROVIDER_SITE_OTHER): Payer: 59 | Admitting: Diagnostic Neuroimaging

## 2014-04-30 VITALS — BP 146/74 | HR 60 | Temp 97.2°F | Ht 62.0 in | Wt 163.0 lb

## 2014-04-30 DIAGNOSIS — G43909 Migraine, unspecified, not intractable, without status migrainosus: Secondary | ICD-10-CM

## 2014-04-30 DIAGNOSIS — M542 Cervicalgia: Secondary | ICD-10-CM

## 2014-04-30 NOTE — Patient Instructions (Signed)
Try physical therapy for neck pain.

## 2014-04-30 NOTE — Progress Notes (Signed)
PATIENT: Alexa Beard DOB: 08/09/1961   REASON FOR VISIT: follow up for Migraine HISTORY FROM: patient  HISTORY OF PRESENT ILLNESS:  UPDATE 04/30/14: Since last visit, doing well on TPX 50mg  daily. Still some fatigue, but improved off propranolol. Also with new issue: neck pain, radiating to left shoulder and arm. This is intermittent, with some pain, numbness, burning.   UPDATE 12/05/13 (LL):  Returns for follow up of Migraine, still having only 1-2 Migraines per month but is concerned over fatigue with Inderal LA.  Would like to try something else as preventative.  UPDATE 06/05/2013 (LL): Since last visit, no changes, still having 1 migraine-type headache per month with approximately 4 milder type stress headaches per month. She states her migraines are sometimes accompanied with aura of spots in her vision and increased sensitivity to light. She cannot identify any headache triggers except stress. She continues to take propranolol daily and Imitrex when necessary for acute headache. For milder stress-type headache she takes extra strength Tylenol. Still with intermittent numbness, confusion, short-term memory problems and word finding difficulties.   UPDATE 11/27/2012 (VRP): Since last visit, now having one headache per month. Much improved. Still with intermittent numbness, confusion, memory. Some intermittent left neck and trapezius pain.   PRIOR HPI (08/23/12, VRP): 53 year old left-handed female with history of migraine, anxiety, here for evaluation of new onset headaches and abnormal MRI. Patient has long history of migraine headaches typically with unilateral throbbing severe pain, seeing spots or blind spots, with nausea and sensitivity light and sound. Previously she was taking Imitrex as needed for migraine control. Over the past one year she developed a new type of headache, global sudden severe headaches with intermittent nausea and vision changes. For the past 6 months she's had  intermittent numbness in her bilateral fingertips, dropping things. Showed similar symptoms 10 years ago, diagnosed by Dr. Sandria ManlyLove, with MRI of the cervical spine and EMG. 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 Patient also complains of mild intermittent progressive short-term memory loss, word finding difficulties. She also reports poor sleep. Husband reports mild snoring. She is restless with mild anxiety.   REVIEW OF SYSTEMS: Full 14 system review of systems performed and notable only for fatigue, cough, ringing in ears trouble swallowing insomnia neck pain back pain joint pain headache numbness decr concentration.   ALLERGIES: Allergies  Allergen Reactions  . Ampicillin Hives  . Benzonatate Itching    HOME MEDICATIONS: Outpatient Prescriptions Prior to Visit  Medication Sig Dispense Refill  . ADVAIR DISKUS 250-50 MCG/DOSE AEPB Inhale 1 puff into the lungs as needed.      . diphenoxylate-atropine (LOMOTIL) 2.5-0.025 MG per tablet Take 1 tablet by mouth as needed for diarrhea or loose stools.      Marland Kitchen. esomeprazole (NEXIUM) 10 MG packet Take 10 mg by mouth as needed.      Marland Kitchen. FLUoxetine (PROZAC) 10 MG capsule Take 10 mg by mouth daily.      . montelukast (SINGULAIR) 10 MG tablet Take 1 tablet by mouth daily.      . SUMAtriptan (IMITREX) 100 MG tablet Take 1 tablet (100 mg total) by mouth daily.  12 tablet  5  . temazepam (RESTORIL) 15 MG capsule Take 15 mg by mouth at bedtime.      . topiramate (TOPAMAX) 50 MG tablet Take 1 tablet (50 mg total) by mouth daily.  90 tablet  1  . VENTOLIN HFA 108 (90 BASE) MCG/ACT inhaler Inhale 1 puff into the  lungs as needed.       No facility-administered medications prior to visit.    PAST MEDICAL HISTORY: History reviewed. No pertinent past medical history.  PAST SURGICAL HISTORY: History reviewed. No pertinent past surgical history.  FAMILY HISTORY: Family History  Problem Relation Age of Onset  . COPD Father   . Dementia Father      SOCIAL HISTORY: History   Social History  . Marital Status: Married    Spouse Name: Byron    Number of Children: 0  . Years of Education: HS   Occupational History  .      [employer]   Social History Main Topics  . Smoking status: Never Smoker   . Smokeless tobacco: Never Used  . Alcohol Use: 5.0 - 10.0 oz/week    10-20 drink(s) per week  . Drug Use: No  . Sexual Activity: Yes    Partners: Female   Other Topics Concern  . Not on file   Social History Narrative   Patient lives at home with family.   Caffeine Use: 1 cup daily     PHYSICAL EXAM  Filed Vitals:   04/30/14 1447  BP: 146/74  Pulse: 60  Temp: 97.2 F (36.2 C)  TempSrc: Oral  Height: 5\' 2"  (1.575 m)  Weight: 163 lb (73.936 kg)   Body mass index is 29.81 kg/(m^2).  GENERAL EXAM: Patient is in no distress; well developed, nourished and groomed; neck is supple, EXCEPT SLIGHT TENDERNESS WITH HEAD ROTATION AND TILT TO LEFT  CARDIOVASCULAR: Regular rate and rhythm, no murmurs, no carotid bruits  NEUROLOGIC: MENTAL STATUS: awake, alert, language fluent, comprehension intact, naming intact, fund of knowledge appropriate CRANIAL NERVE: no papilledema on fundoscopic exam, pupils equal and reactive to light, visual fields full to confrontation, extraocular muscles intact, no nystagmus, facial sensation and strength symmetric, hearing intact, palate elevates symmetrically, uvula midline, shoulder shrug symmetric, tongue midline. MOTOR: normal bulk and tone, full strength in the BUE, BLE SENSORY: normal and symmetric to light touch, temperature, vibration  COORDINATION: finger-nose-finger, fine finger movements normal REFLEXES: deep tendon reflexes present and symmetric GAIT/STATION: narrow based gait; able to walk on toes, heels and tandem; romberg is negative   DIAGNOSTIC DATA (LABS, IMAGING, TESTING) - I reviewed patient records, labs, notes, testing and imaging myself where  available.  07/25/12 MRI brain: few non-specific foci of gliosis.    ASSESSMENT AND PLAN  53 y.o. female with migraine headaches.  Migraines are stable, 1-2 per month, doing well on TPX + sumatriptan. Now with intermittent neck pain radiating to left shoulder arm; could be mild cervical radiculitis.   PLAN:  1. Continue TPX + sumatriptan  2. Try physical therapy x 1-2 months 3. If neck sxs worsen, then we will check MRI cervical spine  Orders Placed This Encounter  Procedures  . Ambulatory referral to Physical Therapy   Return in about 6 months (around 10/30/2014).  Suanne MarkerVIKRAM R. PENUMALLI, MD 04/30/2014, 3:22 PM Certified in Neurology, Neurophysiology and Neuroimaging  [facility] [address], [address] [place], [place] [address] [phone]

## 2014-05-26 ENCOUNTER — Other Ambulatory Visit: Payer: Self-pay | Admitting: Family Medicine

## 2014-05-26 DIAGNOSIS — N63 Unspecified lump in unspecified breast: Secondary | ICD-10-CM

## 2014-05-27 ENCOUNTER — Ambulatory Visit: Payer: 59 | Attending: Diagnostic Neuroimaging | Admitting: Physical Therapy

## 2014-05-27 DIAGNOSIS — M542 Cervicalgia: Secondary | ICD-10-CM | POA: Insufficient documentation

## 2014-05-27 DIAGNOSIS — M25519 Pain in unspecified shoulder: Secondary | ICD-10-CM | POA: Diagnosis not present

## 2014-06-09 ENCOUNTER — Other Ambulatory Visit: Payer: Self-pay | Admitting: Nurse Practitioner

## 2014-07-01 ENCOUNTER — Encounter (INDEPENDENT_AMBULATORY_CARE_PROVIDER_SITE_OTHER): Payer: Self-pay

## 2014-07-01 ENCOUNTER — Ambulatory Visit
Admission: RE | Admit: 2014-07-01 | Discharge: 2014-07-01 | Disposition: A | Discharge status: Home / Self Care | Payer: 59 | Source: Ambulatory Visit | Attending: Family Medicine | Admitting: Family Medicine

## 2014-07-01 ENCOUNTER — Other Ambulatory Visit: Payer: Self-pay | Admitting: Family Medicine

## 2014-07-01 DIAGNOSIS — N63 Unspecified lump in unspecified breast: Secondary | ICD-10-CM

## 2014-07-15 ENCOUNTER — Other Ambulatory Visit: Payer: Self-pay | Admitting: Family Medicine

## 2014-07-15 ENCOUNTER — Other Ambulatory Visit (HOSPITAL_COMMUNITY)
Admission: RE | Admit: 2014-07-15 | Discharge: 2014-07-15 | Disposition: A | Discharge status: Home / Self Care | Payer: 59 | Source: Ambulatory Visit | Attending: Family Medicine | Admitting: Family Medicine

## 2014-07-15 DIAGNOSIS — Z124 Encounter for screening for malignant neoplasm of cervix: Secondary | ICD-10-CM | POA: Insufficient documentation

## 2014-07-17 LAB — CYTOLOGY - PAP

## 2014-10-30 ENCOUNTER — Encounter: Payer: Self-pay | Admitting: Diagnostic Neuroimaging

## 2014-10-30 ENCOUNTER — Ambulatory Visit (INDEPENDENT_AMBULATORY_CARE_PROVIDER_SITE_OTHER): Payer: 59 | Admitting: Diagnostic Neuroimaging

## 2014-10-30 VITALS — BP 140/74 | HR 63 | Temp 97.8°F | Ht 63.0 in | Wt 158.8 lb

## 2014-10-30 DIAGNOSIS — G43009 Migraine without aura, not intractable, without status migrainosus: Secondary | ICD-10-CM

## 2014-10-30 NOTE — Progress Notes (Signed)
PATIENT: Alexa Beard DOB: 12/18/1960   REASON FOR VISIT: follow up for Migraine HISTORY FROM: patient  Chief Complaint  Patient presents with  . Migraine    HISTORY OF PRESENT ILLNESS:  UPDATE 10/30/14: Since last visit, doing well with headaches. Only 3 HA in last 6 months. Neck pain issue better with some PT. Of note, patient's mother now dx'd with alzheimer's, and patient will be moving in with her mother to help her.   UPDATE 04/30/14: Since last visit, doing well on TPX 50mg  daily. Still some fatigue, but improved off propranolol. Also with new issue: neck pain, radiating to left shoulder and arm. This is intermittent, with some pain, numbness, burning.   UPDATE 12/05/13 (LL):  Returns for follow up of Migraine, still having only 1-2 Migraines per month but is concerned over fatigue with Inderal LA.  Would like to try something else as preventative.  UPDATE 06/05/2013 (LL): Since last visit, no changes, still having 1 migraine-type headache per month with approximately 4 milder type stress headaches per month. She states her migraines are sometimes accompanied with aura of spots in her vision and increased sensitivity to light. She cannot identify any headache triggers except stress. She continues to take propranolol daily and Imitrex when necessary for acute headache. For milder stress-type headache she takes extra strength Tylenol. Still with intermittent numbness, confusion, short-term memory problems and word finding difficulties.   UPDATE 11/27/2012 (VRP): Since last visit, now having one headache per month. Much improved. Still with intermittent numbness, confusion, memory. Some intermittent left neck and trapezius pain.   PRIOR HPI (08/23/12, VRP): 53 year old left-handed female with history of migraine, anxiety, here for evaluation of new onset headaches and abnormal MRI. Patient has long history of migraine headaches typically with unilateral throbbing severe pain, seeing  spots or blind spots, with nausea and sensitivity light and sound. Previously she was taking Imitrex as needed for migraine control. Over the past one year she developed a new type of headache, global sudden severe headaches with intermittent nausea and vision changes. For the past 6 months she's had intermittent numbness in her bilateral fingertips, dropping things. Showed similar symptoms 10 years ago, diagnosed by Dr. Sandria ManlyLove, with MRI of the cervical spine and EMG. 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 Patient also complains of mild intermittent progressive short-term memory loss, word finding difficulties. She also reports poor sleep. Husband reports mild snoring. She is restless with mild anxiety.   REVIEW OF SYSTEMS: Full 14 system review of systems performed and notable only for fatigue snoring joint pain decr concentration neck pain neck stiffness cough eye discharge itching cough wheezing.    ALLERGIES: Allergies  Allergen Reactions  . Ampicillin Hives  . Benzonatate Itching    HOME MEDICATIONS: Outpatient Prescriptions Prior to Visit  Medication Sig Dispense Refill  . ADVAIR DISKUS 250-50 MCG/DOSE AEPB Inhale 1 puff into the lungs as needed.    . diphenoxylate-atropine (LOMOTIL) 2.5-0.025 MG per tablet Take 1 tablet by mouth as needed for diarrhea or loose stools.    Marland Kitchen. esomeprazole (NEXIUM) 10 MG packet Take 10 mg by mouth as needed.    Marland Kitchen. FLUoxetine (PROZAC) 10 MG capsule Take 10 mg by mouth daily.    . montelukast (SINGULAIR) 10 MG tablet Take 1 tablet by mouth daily.    . SUMAtriptan (IMITREX) 100 MG tablet Take 1 tablet (100 mg total) by mouth daily. 12 tablet 5  . temazepam (RESTORIL) 15 MG capsule Take 15  mg by mouth at bedtime.    . topiramate (TOPAMAX) 50 MG tablet Take 1 tablet by mouth  daily 90 tablet 1  . VENTOLIN HFA 108 (90 BASE) MCG/ACT inhaler Inhale 1 puff into the lungs as needed.     No facility-administered medications prior to visit.    PAST  MEDICAL HISTORY: No past medical history on file.  PAST SURGICAL HISTORY: No past surgical history on file.  FAMILY HISTORY: Family History  Problem Relation Age of Onset  . COPD Father   . Dementia Father     SOCIAL HISTORY: History   Social History  . Marital Status: Married    Spouse Name: Ortencia KickByron    Number of Children: 0  . Years of Education: HS   Occupational History  .      [employer]   Social History Main Topics  . Smoking status: Never Smoker   . Smokeless tobacco: Never Used  . Alcohol Use: 5.0 - 10.0 oz/week    10-20 drink(s) per week  . Drug Use: No  . Sexual Activity:    Partners: Female   Other Topics Concern  . Not on file   Social History Narrative   Patient lives at home with family.   Caffeine Use: 1 cup daily     PHYSICAL EXAM  Filed Vitals:   10/30/14 1444  BP: 140/74  Pulse: 63  Temp: 97.8 F (36.6 C)  TempSrc: Oral  Height: 5\' 3"  (1.6 m)  Weight: 158 lb 12.8 oz (72.031 kg)   Body mass index is 28.14 kg/(m^2).  GENERAL EXAM: Patient is in no distress; well developed, nourished and groomed; neck is supple  CARDIOVASCULAR: Regular rate and rhythm, no murmurs, no carotid bruits  NEUROLOGIC: MENTAL STATUS: awake, alert, language fluent, comprehension intact, naming intact, fund of knowledge appropriate CRANIAL NERVE: no papilledema on fundoscopic exam, pupils equal and reactive to light, visual fields full to confrontation, extraocular muscles intact, no nystagmus, facial sensation and strength symmetric, hearing intact, palate elevates symmetrically, uvula midline, shoulder shrug symmetric, tongue midline. MOTOR: normal bulk and tone, full strength in the BUE, BLE SENSORY: normal and symmetric to light touch, temperature, vibration  COORDINATION: finger-nose-finger, fine finger movements normal REFLEXES: deep tendon reflexes present and symmetric GAIT/STATION: narrow based gait; romberg is negative   DIAGNOSTIC DATA  (LABS, IMAGING, TESTING) - I reviewed patient records, labs, notes, testing and imaging myself where available.  07/25/12 MRI brain: few non-specific foci of gliosis.    ASSESSMENT AND PLAN  53 y.o. female with migraine headaches.  Migraines are stable, now only 3 in last 6 months, (previously 1-2 per month), doing well on TPX + sumatriptan.   PLAN:  1. continue TPX + sumatriptan   Return in about 1 year (around 10/31/2015) for with NP/PA or Laramie Meissner.   Suanne MarkerVIKRAM R. Nelly Scriven, MD 10/30/2014, 3:17 PM Certified in Neurology, Neurophysiology and Neuroimaging  [facility] [address], [address] [place], [place] [address] [phone]

## 2014-10-30 NOTE — Patient Instructions (Signed)
Continue topiramate and sumatriptan. 

## 2015-01-27 ENCOUNTER — Other Ambulatory Visit: Payer: Self-pay | Admitting: Diagnostic Neuroimaging

## 2015-04-28 ENCOUNTER — Other Ambulatory Visit: Payer: Self-pay | Admitting: Family Medicine

## 2015-04-28 DIAGNOSIS — R922 Inconclusive mammogram: Secondary | ICD-10-CM

## 2015-05-06 ENCOUNTER — Other Ambulatory Visit: Payer: Self-pay

## 2015-05-12 ENCOUNTER — Ambulatory Visit
Admission: RE | Admit: 2015-05-12 | Discharge: 2015-05-12 | Disposition: A | Discharge status: Home / Self Care | Payer: 59 | Source: Ambulatory Visit | Attending: Family Medicine | Admitting: Family Medicine

## 2015-05-12 DIAGNOSIS — R922 Inconclusive mammogram: Secondary | ICD-10-CM

## 2015-06-13 ENCOUNTER — Other Ambulatory Visit: Payer: Self-pay | Admitting: Diagnostic Neuroimaging

## 2015-11-03 ENCOUNTER — Encounter: Payer: Self-pay | Admitting: Adult Health

## 2015-11-03 ENCOUNTER — Ambulatory Visit (INDEPENDENT_AMBULATORY_CARE_PROVIDER_SITE_OTHER): Payer: 59 | Admitting: Adult Health

## 2015-11-03 VITALS — BP 130/78 | HR 73 | Ht 63.0 in | Wt 166.0 lb

## 2015-11-03 DIAGNOSIS — R202 Paresthesia of skin: Secondary | ICD-10-CM | POA: Diagnosis not present

## 2015-11-03 DIAGNOSIS — M542 Cervicalgia: Secondary | ICD-10-CM

## 2015-11-03 DIAGNOSIS — G43009 Migraine without aura, not intractable, without status migrainosus: Secondary | ICD-10-CM | POA: Diagnosis not present

## 2015-11-03 NOTE — Progress Notes (Signed)
I reviewed note and agree with plan.   Suanne MarkerVIKRAM R. PENUMALLI, MD 11/03/2015, 5:22 PM Certified in Neurology, Neurophysiology and Neuroimaging  [facility] [address], [address] [place], [place] [address] [phone]
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
## 2015-11-03 NOTE — Patient Instructions (Signed)
Continue Topamax and Imitrex for headache MRI cervical spine for neck pain Try wrist splints for possible carpal tunnel? If your symptoms worsen or you develop new symptoms please let us know.

## 2015-11-04 ENCOUNTER — Other Ambulatory Visit: Payer: Self-pay | Admitting: Adult Health

## 2015-11-04 DIAGNOSIS — M542 Cervicalgia: Secondary | ICD-10-CM

## 2015-11-04 DIAGNOSIS — R202 Paresthesia of skin: Secondary | ICD-10-CM

## 2015-11-04 NOTE — Addendum Note (Signed)
Addended by: Enedina FinnerMILLIKAN, Tia Gelb P on: 11/04/2015 11:42 AM   Modules accepted: Orders

## 2015-11-07 ENCOUNTER — Other Ambulatory Visit: Payer: Self-pay | Admitting: Diagnostic Neuroimaging

## 2015-11-25 ENCOUNTER — Ambulatory Visit (INDEPENDENT_AMBULATORY_CARE_PROVIDER_SITE_OTHER): Payer: 59

## 2015-11-25 DIAGNOSIS — M542 Cervicalgia: Secondary | ICD-10-CM

## 2015-11-25 DIAGNOSIS — R202 Paresthesia of skin: Secondary | ICD-10-CM

## 2015-11-30 ENCOUNTER — Telehealth: Payer: Self-pay | Admitting: Adult Health

## 2015-11-30 NOTE — Telephone Encounter (Signed)
I called the patient. I discussed with the patient results of her MRI. After consulting with Dr. Marjory LiesPenumalli- the patient can be referred to neurosurgery or if she would like a more conservative approach we can send her for physical therapy and pain management. The patient would like to think about her options and she will get back in touch with us.  MRI cervical spine: IMPRESSION:  Abnormal MRI cervical spine (without) demonstrating: 1. 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 2. At C5-6: disc bulging and uncovertebral joint hypertrophy with mild left foraminal stenosis. 3. At C6-7: uncovertebral joint hypertrophy with mild biforaminal stenosis

## 2016-02-03 ENCOUNTER — Encounter: Payer: Self-pay | Admitting: Adult Health

## 2016-02-03 ENCOUNTER — Ambulatory Visit (INDEPENDENT_AMBULATORY_CARE_PROVIDER_SITE_OTHER): Payer: 59 | Admitting: Adult Health

## 2016-02-03 VITALS — BP 112/82 | HR 72 | Resp 20 | Ht 63.0 in | Wt 160.0 lb

## 2016-02-03 DIAGNOSIS — G43009 Migraine without aura, not intractable, without status migrainosus: Secondary | ICD-10-CM

## 2016-02-03 DIAGNOSIS — M542 Cervicalgia: Secondary | ICD-10-CM

## 2016-02-03 NOTE — Patient Instructions (Signed)
physical therapy for neck pain  Continue Topamax and Imitrex for headaches
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
## 2016-02-17 ENCOUNTER — Encounter: Payer: Self-pay | Admitting: Physical Therapy

## 2016-02-17 ENCOUNTER — Ambulatory Visit: Payer: 59 | Attending: Diagnostic Neuroimaging | Admitting: Physical Therapy

## 2016-02-17 DIAGNOSIS — M542 Cervicalgia: Secondary | ICD-10-CM | POA: Diagnosis not present

## 2016-02-17 DIAGNOSIS — R252 Cramp and spasm: Secondary | ICD-10-CM | POA: Insufficient documentation

## 2016-02-17 DIAGNOSIS — M436 Torticollis: Secondary | ICD-10-CM | POA: Diagnosis not present

## 2016-02-17 NOTE — Therapy (Signed)
[facility] [address] [place], [place], [address] Phone: [phone]   Fax:  [phone]  Physical Therapy Evaluation  Patient Details  Name: Alexa Beard MRN: [id number] Date of Birth: 08/14/1961 Referring Provider: Everlene BallsPneumali  Encounter Date: 02/17/2016      PT End of Session - 02/17/16 1647    Visit Number 1   PT Start Time 1555   PT Stop Time 1648   PT Time Calculation (min) 53 min   Activity Tolerance Patient tolerated treatment well   Behavior During Therapy [facility] for tasks assessed/performed      Past Medical History  Diagnosis Date  . TMJ (dislocation of temporomandibular joint)   . Headache     History reviewed. No pertinent past surgical history.  There were no vitals filed for this visit.  Visit Diagnosis:  Neck pain - Plan: PT plan of care cert/re-cert  Neck stiffness - Plan: PT plan of care cert/re-cert      Subjective Assessment - 02/17/16 1602    Subjective Pateint reports that she has been having neck pain for a few years.  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     Limitations Reading;House hold activities   Diagnostic tests MRI   Patient Stated Goals have less pain   Currently in Pain? Yes   Pain Score 8    Pain Location Neck   Pain Orientation Right;Left;Upper   Pain Descriptors / Indicators Aching;Numbness;Tingling;Tightness   Pain Type Chronic pain   Pain Onset More than a month ago   Pain Frequency Constant   Aggravating Factors  head turns, looking down pain can be up to 9/10   Pain Relieving Factors heat can help pain go down to a 6/10   Effect of Pain on Daily Activities difficulty with everything            [facility] - 02/17/16 0001    Assessment   Medical Diagnosis neck pain, stenosis   Referring Provider Pneumali   Onset Date/Surgical Date 01/17/16   Hand Dominance  Left   Prior Therapy in the past   Precautions   Precautions None   Balance Screen   Has the patient fallen in the past 6 months No   Has the patient had a decrease in activity level because of a fear of falling?  No   Is the patient reluctant to leave their home because of a fear of falling?  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   Leisure walks for exercise   Posture/Postural Control   Posture Comments fwd head, rounded shoulders, gaurded motions and posture   AROM   Overall AROM Comments shoulder ROM WFL's, Cervical ROM flexion decreased 50%, extension WNL's, rotation and side bending are limited 50% all cervical ROM increased pain in the neck   Strength   Overall Strength Comments Shoulder strength 4/5 mild discomfort   Flexibility   Soft Tissue Assessment /Muscle Length --  some positive neural tension signs bilaterally   Palpation   Palpation comment very tight and tender to the upper traps, rhomboids and cervical paraspinals   Special Tests    Special Tests --  negative manual  distraction                   [facility] Adult PT Treatment/Exercise - 02/17/16 0001    Modalities   Modalities Electrical Stimulation   Electrical Stimulation   Electrical Stimulation Location to the C/T area   Electrical Stimulation Action IFC   Electrical Stimulation Parameters supine   Electrical Stimulation Goals Pain                PT Education - 02/17/16 1646    Education provided Yes   Education Details HEP for shrugs, scapular and cervical retraction   Person(s) Educated Patient   Methods Explanation;Demonstration;Handout   Comprehension Verbalized understanding          PT Short Term Goals - 02/17/16 1658    PT SHORT TERM GOAL #1   Title independent with initial HEP   Time 1   Period Weeks    Status New           PT Long Term Goals - 02/17/16 1658    PT LONG TERM GOAL #1   Title decrease pain 50%   Time 8   Period Weeks   Status New   PT LONG TERM GOAL #2   Title understand proper posture and body mechanics   Time 8   Period Weeks   Status New   PT LONG TERM GOAL #3   Title increase cervical ROM 25%   Time 8   Period Weeks   Status New   PT LONG TERM GOAL #4   Title report 50% less difficulty backing up the car   Time 8   Period Weeks   Status New               Plan - 02/17/16 1648    Clinical Impression Statement Patient with neck pain for about two years, had PT in the past without relief, however no modalities or traciton was performed, a recent MRI showed bulging discs at 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  She is very tight and tender in the cervical parapsinals, upper traps and rhomboids.  She has difficulty relaxing so we deferred cervical traction to the next visit   Pt will benefit from skilled therapeutic intervention in order to improve on the following deficits Decreased range of motion;Increased fascial restricitons;Increased muscle spasms;Postural dysfunction;Improper body mechanics;Pain   Rehab Potential Good   PT Frequency 2x / week   PT Duration 8 weeks   PT Treatment/Interventions ADLs/Self Care Home Management;Cryotherapy;Electrical Stimulation;Moist Heat;Therapeutic exercise;Therapeutic activities;Ultrasound;Traction;Neuromuscular re-education;Patient/family education;Manual techniques;Taping   PT Next Visit Plan Slowly try some exercises and add traction next visit   Consulted and Agree with Plan of Care Patient         Problem List Patient Active Problem List   Diagnosis Date Noted  . Migraine 06/05/2013    Jearld Lesch., PT 02/17/2016, 5:02 PM  [facility] [address] [place], [place], [address] Phone: [phone]   Fax:  [phone]  Name: Alexa Beard MRN: [id number] Date of Birth: [birth year]

## 2016-02-23 ENCOUNTER — Ambulatory Visit: Payer: 59 | Admitting: Physical Therapy

## 2016-02-25 ENCOUNTER — Ambulatory Visit: Payer: 59 | Admitting: Physical Therapy

## 2016-02-25 ENCOUNTER — Encounter: Payer: Self-pay | Admitting: Physical Therapy

## 2016-02-25 DIAGNOSIS — R252 Cramp and spasm: Secondary | ICD-10-CM

## 2016-02-25 DIAGNOSIS — M542 Cervicalgia: Secondary | ICD-10-CM | POA: Diagnosis not present

## 2016-02-25 NOTE — Addendum Note (Signed)
Addended by: Jearld LeschALBRIGHT, Devi Hopman W on: 02/25/2016 04:30 PM   Modules accepted: Orders

## 2016-02-25 NOTE — Therapy (Signed)
[facility] [address] [place], [place], [address] Phone: [phone]   Fax:  [phone]  Physical Therapy Treatment  Patient Details  Name: Alexa Beard MRN: [id number] Date of Birth: 12/29/1960 Referring Provider: Everlene BallsPneumali  Encounter Date: 02/25/2016      PT End of Session - 02/25/16 1612    Visit Number 2   PT Start Time 1515   PT Stop Time 1617   PT Time Calculation (min) 62 min   Activity Tolerance Patient tolerated treatment well   Behavior During Therapy [facility] for tasks assessed/performed      Past Medical History  Diagnosis Date  . TMJ (dislocation of temporomandibular joint)   . Headache     History reviewed. No pertinent past surgical history.  There were no vitals filed for this visit.      Subjective Assessment - 02/25/16 1511    Subjective Patient reports she has been doing her exercises at home at her desk. "Not much has changed since a week ago."   Currently in Pain? Yes   Pain Score 7    Pain Location Neck   Pain Orientation Right;Left;Upper                         [facility] Adult PT Treatment/Exercise - 02/25/16 0001    Exercises   Exercises Neck   Neck Exercises: Machines for Strengthening   UBE (Upper Arm Bike) 2 fwd, 2 bwd L1   Neck Exercises: Theraband   Shoulder Extension 10 reps;Red   Rows 10 reps;Red   Shoulder External Rotation 10 reps;Red   Neck Exercises: Standing   Neck Retraction 10 reps  3 sec hold   Wall Wash Flexion x10   Other Standing Exercises Chin tucks 3 sec hold x10   Modalities   Modalities Electrical Stimulation;Moist Heat   Moist Heat Therapy   Number Minutes Moist Heat 15 Minutes   Moist Heat Location Cervical   Electrical Stimulation   Electrical Stimulation Location to the C/T area   Electrical Stimulation Action IFC   Electrical Stimulation Parameters supine   Electrical Stimulation Goals Pain   Manual Therapy   Manual Therapy  Passive ROM;Manual Traction;Soft tissue mobilization   Manual therapy comments better relaxation   Soft tissue mobilization UT trigger points, occipital release   Manual Traction to patient tolerance 30 sec hold   Neck Exercises: Stretches   Upper Trapezius Stretch 3 reps;20 seconds  both ways   Levator Stretch 3 reps;20 seconds  both ways                  PT Short Term Goals - 02/17/16 1658    PT SHORT TERM GOAL #1   Title independent with initial HEP   Time 1   Period Weeks   Status New           PT Long Term Goals - 02/17/16 1658    PT LONG TERM GOAL #1   Title decrease pain 50%   Time 8   Period Weeks   Status New   PT LONG TERM GOAL #2   Title understand proper posture and body mechanics   Time 8   Period Weeks   Status New   PT LONG TERM GOAL #3   Title increase cervical ROM 25%   Time 8   Period Weeks   Status New   PT LONG TERM GOAL #4   Title report  50% less difficulty backing up the car   Time 8   Period Weeks   Status New               Plan - 02/25/16 1614    Clinical Impression Statement Pt tolerated ther ex well, performing motions slow and controlled within tolerance. She felt pain relief from occipital release, UT trigger point release, and manual cervical traction. Continues with tightness in entire UT area.   PT Treatment/Interventions ADLs/Self Care Home Management;Cryotherapy;Electrical Stimulation;Moist Heat;Therapeutic exercise;Therapeutic activities;Ultrasound;Traction;Neuromuscular re-education;Patient/family education;Manual techniques;Taping   PT Next Visit Plan Progress ther ex to pt. tolerance. Continue occipital release, trigger point release, and perform mechanical cervical traction if able to relax.      Patient will benefit from skilled therapeutic intervention in order to improve the following deficits and impairments:     Visit Diagnosis: Cervicalgia  Cramp and spasm     Problem List Patient Active  Problem List   Diagnosis Date Noted  . Migraine 06/05/2013    Ronette Deter SPTA 02/25/2016, 4:26 PM  [facility] [address] [place], [place], [address] Phone: [phone]   Fax:  [phone]  Name: Alexa Beard MRN: [id number] Date of Birth: 1961/03/31

## 2016-03-01 ENCOUNTER — Ambulatory Visit: Payer: 59 | Admitting: Physical Therapy

## 2016-03-01 ENCOUNTER — Encounter: Payer: Self-pay | Admitting: Physical Therapy

## 2016-03-01 DIAGNOSIS — M542 Cervicalgia: Secondary | ICD-10-CM

## 2016-03-01 NOTE — Therapy (Signed)
[facility] [address] [place], [place], [address] Phone: [phone]   Fax:  [phone]  Physical Therapy Treatment  Patient Details  Name: Alexa Beard MRN: [id number] Date of Birth: [birth year] Referring Provider: Everlene Balls  Encounter Date: 03/01/2016      PT End of Session - 03/01/16 1600    Visit Number 3   PT Start Time 1517   PT Stop Time 1614   PT Time Calculation (min) 57 min   Activity Tolerance Patient tolerated treatment well   Behavior During Therapy [facility] for tasks assessed/performed      Past Medical History  Diagnosis Date  . TMJ (dislocation of temporomandibular joint)   . Headache     History reviewed. No pertinent past surgical history.  There were no vitals filed for this visit.      Subjective Assessment - 03/01/16 1518    Subjective Pt reports that she feel good when she leaves here but the pain comes back after a day or 2   Currently in Pain? Yes   Pain Score 6    Pain Location Neck   Pain Orientation Right;Left;Upper                         [facility] Adult PT Treatment/Exercise - 03/01/16 0001    Exercises   Exercises Neck   Neck Exercises: Machines for Strengthening   UBE (Upper Arm Bike) 3 fwd, 3 bwd L2   Neck Exercises: Theraband   Shoulder Extension 10 reps;Red  2 sets    Rows 15 reps;Green   Shoulder External Rotation Red;15 reps   Other Theraband Exercises Shoulder flex #2 2x10    Other Theraband Exercises Shoulder abduction #1 2x10    Neck Exercises: Seated   Other Seated Exercise Seated rows #20 2x15    Other Seated Exercise Lat Pull downs #20 2x15   Neck Exercises: Supine   Other Supine Exercise supine neck retraction on ball 3 way 2x10    Modalities   Modalities Electrical Stimulation;Moist Heat   Moist Heat Therapy   Number Minutes Moist Heat 15 Minutes   Moist Heat Location Cervical   Electrical Stimulation   Electrical Stimulation Location  to the C/T area   Electrical Stimulation Action IFC   Electrical Stimulation Parameters supine   Electrical Stimulation Goals Pain   Manual Therapy   Manual Therapy Passive ROM;Manual Traction;Soft tissue mobilization   Manual therapy comments better relaxation   Soft tissue mobilization UT trigger points, occipital release   Passive ROM all directions to cervical spine   Manual Traction to patient tolerance 30 sec hold                  PT Short Term Goals - 02/17/16 1658    PT SHORT TERM GOAL #1   Title independent with initial HEP   Time 1   Period Weeks   Status New           PT Long Term Goals - 02/17/16 1658    PT LONG TERM GOAL #1   Title decrease pain 50%   Time 8   Period Weeks   Status New   PT LONG TERM GOAL #2   Title understand proper posture and body mechanics   Time 8   Period Weeks   Status New   PT LONG TERM GOAL #3   Title increase cervical ROM 25%   Time 8  Period Weeks   Status New   PT LONG TERM GOAL #4   Title report 50% less difficulty backing up the car   Time 8   Period Weeks   Status New               Plan - 03/01/16 1600    Clinical Impression Statement Difficult for pt to relax with MT, pt seems to relax more as MT progressed. Tolerance of today's interventions went well, no reports of increase pain. Tolerated a progression to supine neck retractions with good strength.  Continued tightness in UT area.   PT Frequency 2x / week   PT Duration 8 weeks   PT Treatment/Interventions ADLs/Self Care Home Management;Cryotherapy;Electrical Stimulation;Moist Heat;Therapeutic exercise;Therapeutic activities;Ultrasound;Traction;Neuromuscular re-education;Patient/family education;Manual techniques;Taping   PT Next Visit Plan Progress ther ex to pt. tolerance. Continue occipital release, trigger point release, and perform mechanical cervical traction if able to relax.      Patient will benefit from skilled therapeutic  intervention in order to improve the following deficits and impairments:  Decreased range of motion, Increased fascial restricitons, Increased muscle spasms, Postural dysfunction, Improper body mechanics, Pain  Visit Diagnosis: Cervicalgia     Problem List Patient Active Problem List   Diagnosis Date Noted  . Migraine 06/05/2013    Grayce Sessionsonald G Takashi Korol, PTA  03/01/2016, 4:04 PM  [facility] [address] [place], [place], [address] Phone: [phone]   Fax:  [phone]  Name: Alexa Beard MRN: [id number] Date of Birth: 07/22/1961

## 2016-03-03 ENCOUNTER — Encounter: Payer: Self-pay | Admitting: Physical Therapy

## 2016-03-03 ENCOUNTER — Ambulatory Visit: Payer: 59 | Admitting: Physical Therapy

## 2016-03-03 DIAGNOSIS — M542 Cervicalgia: Secondary | ICD-10-CM

## 2016-03-03 DIAGNOSIS — R252 Cramp and spasm: Secondary | ICD-10-CM

## 2016-03-03 NOTE — Therapy (Addendum)
[place], [place], [address] Phone: [phone]   Fax:  [phone]  Physical Therapy Treatment  Patient Details  Name: Alexa Beard MRN: [id number] Date of Birth: 12/12/1960 Referring Provider: Launa Grill  Encounter Date: 03/03/2016      PT End of Session - 03/03/16 1612    Visit Number 4   PT Start Time 1520   PT Stop Time 1620   PT Time Calculation (min) 60 min   Activity Tolerance Patient tolerated treatment well   Behavior During Therapy [facility] for tasks assessed/performed      Past Medical History  Diagnosis Date  . TMJ (dislocation of temporomandibular joint)   . Headache     History reviewed. No pertinent past surgical history.  There were no vitals filed for this visit.      Subjective Assessment - 03/03/16 1520    Subjective "Not good today I don't know if I slept wrong or what" Pt reports that she has pin starting from her  shoulder blade and goes up into her neck. Pt reports that her pain started yesterday morning.   Currently in Pain? Yes   Pain Score 8    Pain Location --  L middle of back goes up into the neck.                         [place] [facility] PT Treatment/Exercise - 03/03/16 0001    Neck Exercises: Machines for Strengthening   UBE (Upper Arm Bike) 3 fwd, 3 bwd L2   Neck Exercises: Theraband   Shoulder Extension 15 reps;Green  2 sets    Rows 15 reps;Green  2 sets    Shoulder External Rotation 15 reps;Red   Horizontal ADduction 10 reps;Red  2 sets    Other Theraband Exercises Shoulder flex #2 2x15    Other Theraband Exercises Shoulder abduction #2 2x10; OHP with yellow ball 2x15   Neck Exercises: Standing   Neck Retraction 10 reps  2 sets    Modalities   Modalities Electrical Stimulation;Moist Heat   Moist Heat Therapy   Number Minutes Moist Heat 15 Minutes   Moist Heat Location Cervical   Electrical Stimulation   Electrical Stimulation  Location to the C/T area   Electrical Stimulation Action IFC   Electrical Stimulation Parameters supine   Electrical Stimulation Goals Pain   Manual Therapy   Manual Therapy Passive ROM;Manual Traction;Soft tissue mobilization   Soft tissue mobilization UT trigger points, occipital release; posterior cervical para spinales; Trigger point release rhomboids, STM mid thoracic spine.   Passive ROM all directions to cervical spine   Manual Traction to patient tolerance 30 sec hold                  PT Short Term Goals - 02/17/16 1658    PT SHORT TERM GOAL #1   Title independent with initial HEP   Time 1   Period Weeks   Status New           PT Long Term Goals - 02/17/16 1658    PT LONG TERM GOAL #1   Title decrease pain 50%   Time 8   Period Weeks   Status New   PT LONG TERM GOAL #2   Title understand proper posture and body mechanics   Time 8   Period Weeks   Status New   PT LONG TERM GOAL #3  Title increase cervical ROM 25%   Time 8   Period Weeks   Status New   PT LONG TERM GOAL #4   Title report 50% less difficulty backing up the car   Time 8   Period Weeks   Status New               Plan - 03/03/16 1609    Clinical Impression Statement Majority of today's treatment focuses on MT due to pt subjective c/o pain. Good PROM with cervical spine, trigger pt noted in L rhomboid area. Pt able to perform all exercises without any increase in pain.    Rehab Potential Good   PT Frequency 2x / week   PT Duration 8 weeks   PT Treatment/Interventions ADLs/Self Care Home Management;Cryotherapy;Electrical Stimulation;Moist Heat;Therapeutic exercise;Therapeutic activities;Ultrasound;Traction;Neuromuscular re-education;Patient/family education;Manual techniques;Taping   PT Next Visit Plan asscess MT.      Patient will benefit from skilled therapeutic intervention in order to improve the following deficits and impairments:  Decreased range of motion, Increased  fascial restricitons, Increased muscle spasms, Postural dysfunction, Improper body mechanics, Pain  Visit Diagnosis: Cervicalgia  Cramp and spasm     Problem List Patient Active Problem List   Diagnosis Date Noted  . Migraine 06/05/2013    PHYSICAL THERAPY DISCHARGE SUMMARY  Visits from Start of Care: 4  Plan: Patient agrees to discharge.  Patient goals were not met. Patient is being discharged due to not returning since the last visit.  ?????       Scot Jun, PTA  03/03/2016, 4:16 PM  [place], [place], [address] Phone: [phone]   Fax:  [phone]  Name: Alexa Beard MRN: [id number] Date of Birth: 15-Apr-1961

## 2016-03-17 ENCOUNTER — Other Ambulatory Visit: Payer: Self-pay | Admitting: Family Medicine

## 2016-03-17 DIAGNOSIS — N950 Postmenopausal bleeding: Secondary | ICD-10-CM

## 2016-03-22 ENCOUNTER — Ambulatory Visit
Admission: RE | Admit: 2016-03-22 | Discharge: 2016-03-22 | Disposition: A | Discharge status: Home / Self Care | Payer: 59 | Source: Ambulatory Visit | Attending: Family Medicine | Admitting: Family Medicine

## 2016-03-22 DIAGNOSIS — N950 Postmenopausal bleeding: Secondary | ICD-10-CM

## 2016-03-29 ENCOUNTER — Other Ambulatory Visit: Payer: Self-pay | Admitting: Nurse Practitioner

## 2016-03-29 DIAGNOSIS — R1900 Intra-abdominal and pelvic swelling, mass and lump, unspecified site: Secondary | ICD-10-CM

## 2016-04-06 ENCOUNTER — Ambulatory Visit
Admission: RE | Admit: 2016-04-06 | Discharge: 2016-04-06 | Disposition: A | Discharge status: Home / Self Care | Payer: 59 | Source: Ambulatory Visit | Attending: Nurse Practitioner | Admitting: Nurse Practitioner

## 2016-04-06 DIAGNOSIS — R1900 Intra-abdominal and pelvic swelling, mass and lump, unspecified site: Secondary | ICD-10-CM

## 2016-04-06 MED ORDER — GADOBENATE DIMEGLUMINE 529 MG/ML IV SOLN
15.0000 mL | Freq: Once | INTRAVENOUS | Status: AC | PRN
  Administered 2016-04-06: 15 mL via INTRAVENOUS

## 2016-04-11 ENCOUNTER — Other Ambulatory Visit: Payer: Self-pay | Admitting: Diagnostic Neuroimaging

## 2016-04-26 HISTORY — PX: OTHER SURGICAL HISTORY: SHX169

## 2016-04-26 HISTORY — PX: APPENDECTOMY: SHX54

## 2016-04-26 HISTORY — PX: ABDOMINAL SURGERY: SHX537

## 2016-08-04 ENCOUNTER — Ambulatory Visit (INDEPENDENT_AMBULATORY_CARE_PROVIDER_SITE_OTHER): Payer: 59 | Admitting: Diagnostic Neuroimaging

## 2016-08-04 ENCOUNTER — Encounter: Payer: Self-pay | Admitting: Diagnostic Neuroimaging

## 2016-08-04 VITALS — BP 143/80 | HR 63 | Wt 156.6 lb

## 2016-08-04 DIAGNOSIS — R202 Paresthesia of skin: Secondary | ICD-10-CM | POA: Diagnosis not present

## 2016-08-04 DIAGNOSIS — M542 Cervicalgia: Secondary | ICD-10-CM | POA: Diagnosis not present

## 2016-08-04 DIAGNOSIS — G43009 Migraine without aura, not intractable, without status migrainosus: Secondary | ICD-10-CM | POA: Diagnosis not present

## 2016-08-04 MED ORDER — TOPIRAMATE 50 MG PO TABS
50.0000 mg | ORAL_TABLET | Freq: Every day | ORAL | 4 refills | Status: DC
Start: 2016-08-04 — End: 2017-10-17

## 2016-08-04 MED ORDER — SUMATRIPTAN SUCCINATE 100 MG PO TABS: 100.0000 mg | ORAL_TABLET | Freq: Every day | ORAL | 5 refills | Status: DC | PRN

## 2016-08-04 NOTE — Patient Instructions (Signed)
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
## 2016-10-13 ENCOUNTER — Other Ambulatory Visit: Payer: Self-pay | Admitting: Family Medicine

## 2016-10-13 DIAGNOSIS — N6310 Unspecified lump in the right breast, unspecified quadrant: Secondary | ICD-10-CM

## 2016-10-27 ENCOUNTER — Ambulatory Visit
Admission: RE | Admit: 2016-10-27 | Discharge: 2016-10-27 | Disposition: A | Discharge status: Home / Self Care | Payer: 59 | Source: Ambulatory Visit | Attending: Family Medicine | Admitting: Family Medicine

## 2016-10-27 ENCOUNTER — Other Ambulatory Visit: Payer: Self-pay | Admitting: Family Medicine

## 2016-10-27 DIAGNOSIS — N644 Mastodynia: Secondary | ICD-10-CM

## 2016-10-27 DIAGNOSIS — N6310 Unspecified lump in the right breast, unspecified quadrant: Secondary | ICD-10-CM

## 2017-08-07 ENCOUNTER — Ambulatory Visit: Payer: 59 | Admitting: Diagnostic Neuroimaging

## 2017-08-08 ENCOUNTER — Encounter: Payer: Self-pay | Admitting: Diagnostic Neuroimaging

## 2017-09-27 ENCOUNTER — Other Ambulatory Visit: Payer: Self-pay | Admitting: Family Medicine

## 2017-09-27 DIAGNOSIS — Z1231 Encounter for screening mammogram for malignant neoplasm of breast: Secondary | ICD-10-CM

## 2017-10-17 ENCOUNTER — Other Ambulatory Visit: Payer: Self-pay | Admitting: Diagnostic Neuroimaging

## 2017-10-31 ENCOUNTER — Ambulatory Visit
Admission: RE | Admit: 2017-10-31 | Discharge: 2017-10-31 | Disposition: A | Discharge status: Home / Self Care | Payer: 59 | Source: Ambulatory Visit | Attending: Family Medicine | Admitting: Family Medicine

## 2017-10-31 DIAGNOSIS — Z1231 Encounter for screening mammogram for malignant neoplasm of breast: Secondary | ICD-10-CM

## 2017-12-12 ENCOUNTER — Ambulatory Visit: Payer: 59 | Admitting: Diagnostic Neuroimaging

## 2018-02-14 ENCOUNTER — Ambulatory Visit (INDEPENDENT_AMBULATORY_CARE_PROVIDER_SITE_OTHER): Payer: 59 | Admitting: Diagnostic Neuroimaging

## 2018-02-14 ENCOUNTER — Encounter: Payer: Self-pay | Admitting: Diagnostic Neuroimaging

## 2018-02-14 VITALS — BP 119/73 | HR 66 | Ht 63.0 in | Wt 147.4 lb

## 2018-02-14 DIAGNOSIS — M542 Cervicalgia: Secondary | ICD-10-CM | POA: Diagnosis not present

## 2018-02-14 DIAGNOSIS — G43009 Migraine without aura, not intractable, without status migrainosus: Secondary | ICD-10-CM

## 2018-02-14 DIAGNOSIS — R202 Paresthesia of skin: Secondary | ICD-10-CM | POA: Diagnosis not present

## 2018-02-14 MED ORDER — TOPIRAMATE 50 MG PO TABS: 50.0000 mg | ORAL_TABLET | Freq: Every day | ORAL | 4 refills | Status: DC

## 2018-02-14 MED ORDER — SUMATRIPTAN SUCCINATE 100 MG PO TABS: 100.0000 mg | ORAL_TABLET | Freq: Every day | ORAL | 4 refills | Status: DC | PRN
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
## 2018-02-14 NOTE — Progress Notes (Signed)
error 

## 2018-08-20 ENCOUNTER — Ambulatory Visit: Payer: 59 | Admitting: Diagnostic Neuroimaging

## 2018-08-29 ENCOUNTER — Ambulatory Visit (INDEPENDENT_AMBULATORY_CARE_PROVIDER_SITE_OTHER): Payer: 59 | Admitting: Diagnostic Neuroimaging

## 2018-08-29 ENCOUNTER — Encounter: Payer: Self-pay | Admitting: Diagnostic Neuroimaging

## 2018-08-29 VITALS — BP 122/71 | HR 64 | Ht 63.0 in | Wt 151.6 lb

## 2018-08-29 DIAGNOSIS — G43009 Migraine without aura, not intractable, without status migrainosus: Secondary | ICD-10-CM | POA: Diagnosis not present

## 2018-08-29 DIAGNOSIS — R202 Paresthesia of skin: Secondary | ICD-10-CM

## 2018-08-29 MED ORDER — SUMATRIPTAN SUCCINATE 100 MG PO TABS: 100.0000 mg | ORAL_TABLET | Freq: Every day | ORAL | 12 refills | Status: DC | PRN

## 2018-08-29 MED ORDER — TOPIRAMATE 50 MG PO TABS: 50.0000 mg | ORAL_TABLET | Freq: Every day | ORAL | 4 refills | Status: DC
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
## 2018-09-24 ENCOUNTER — Other Ambulatory Visit: Payer: Self-pay | Admitting: Family Medicine

## 2018-09-24 DIAGNOSIS — Z1231 Encounter for screening mammogram for malignant neoplasm of breast: Secondary | ICD-10-CM

## 2018-09-27 ENCOUNTER — Encounter: Payer: 59 | Admitting: Diagnostic Neuroimaging

## 2018-11-09 ENCOUNTER — Ambulatory Visit
Admission: RE | Admit: 2018-11-09 | Discharge: 2018-11-09 | Disposition: A | Discharge status: Home / Self Care | Payer: 59 | Source: Ambulatory Visit | Attending: Family Medicine | Admitting: Family Medicine

## 2018-11-09 DIAGNOSIS — Z1231 Encounter for screening mammogram for malignant neoplasm of breast: Secondary | ICD-10-CM

## 2019-09-04 ENCOUNTER — Encounter: Payer: Self-pay | Admitting: Diagnostic Neuroimaging

## 2019-09-04 ENCOUNTER — Other Ambulatory Visit: Payer: Self-pay

## 2019-09-04 ENCOUNTER — Ambulatory Visit (INDEPENDENT_AMBULATORY_CARE_PROVIDER_SITE_OTHER): Payer: 59 | Admitting: Diagnostic Neuroimaging

## 2019-09-04 VITALS — BP 132/74 | HR 69 | Temp 98.9°F | Ht 63.0 in | Wt 147.8 lb

## 2019-09-04 DIAGNOSIS — R202 Paresthesia of skin: Secondary | ICD-10-CM | POA: Diagnosis not present

## 2019-09-04 DIAGNOSIS — M542 Cervicalgia: Secondary | ICD-10-CM | POA: Diagnosis not present

## 2019-09-04 DIAGNOSIS — G43009 Migraine without aura, not intractable, without status migrainosus: Secondary | ICD-10-CM

## 2019-09-04 MED ORDER — SUMATRIPTAN SUCCINATE 100 MG PO TABS: 100.0000 mg | ORAL_TABLET | Freq: Every day | ORAL | 12 refills | Status: DC | PRN

## 2019-09-04 MED ORDER — TOPIRAMATE 50 MG PO TABS: 50.0000 mg | ORAL_TABLET | Freq: Every day | ORAL | 4 refills | Status: DC
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
## 2019-10-02 ENCOUNTER — Other Ambulatory Visit: Payer: Self-pay | Admitting: Family Medicine

## 2019-10-02 DIAGNOSIS — Z1231 Encounter for screening mammogram for malignant neoplasm of breast: Secondary | ICD-10-CM

## 2019-11-28 ENCOUNTER — Other Ambulatory Visit: Payer: Self-pay

## 2019-11-28 ENCOUNTER — Ambulatory Visit
Admission: RE | Admit: 2019-11-28 | Discharge: 2019-11-28 | Disposition: A | Discharge status: Home / Self Care | Payer: 59 | Source: Ambulatory Visit | Attending: Family Medicine | Admitting: Family Medicine

## 2019-11-28 DIAGNOSIS — Z1231 Encounter for screening mammogram for malignant neoplasm of breast: Secondary | ICD-10-CM

## 2020-08-11 ENCOUNTER — Other Ambulatory Visit: Payer: Self-pay | Admitting: Diagnostic Neuroimaging

## 2020-09-07 ENCOUNTER — Other Ambulatory Visit: Payer: Self-pay

## 2020-09-07 ENCOUNTER — Ambulatory Visit (INDEPENDENT_AMBULATORY_CARE_PROVIDER_SITE_OTHER): Payer: 59 | Admitting: Diagnostic Neuroimaging

## 2020-09-07 ENCOUNTER — Encounter: Payer: Self-pay | Admitting: Diagnostic Neuroimaging

## 2020-09-07 VITALS — BP 137/74 | HR 60 | Ht 63.0 in | Wt 146.0 lb

## 2020-09-07 DIAGNOSIS — G43009 Migraine without aura, not intractable, without status migrainosus: Secondary | ICD-10-CM | POA: Diagnosis not present

## 2020-09-07 DIAGNOSIS — M542 Cervicalgia: Secondary | ICD-10-CM

## 2020-09-07 DIAGNOSIS — R202 Paresthesia of skin: Secondary | ICD-10-CM | POA: Diagnosis not present

## 2020-09-07 MED ORDER — TOPIRAMATE 50 MG PO TABS
50.0000 mg | ORAL_TABLET | Freq: Every day | ORAL | 4 refills | Status: DC
Start: 2020-09-07 — End: 2021-09-16

## 2020-09-07 MED ORDER — SUMATRIPTAN SUCCINATE 100 MG PO TABS: 100.0000 mg | ORAL_TABLET | Freq: Every day | ORAL | 12 refills | Status: DC | PRN
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
## 2020-09-07 NOTE — Patient Instructions (Signed)
MIGRAINE WITH AURA (stable) - continue topiramate 50mg  daily + sumatriptan as needed  NECK PAIN + HAND NUMBNESS  - continue PT exercises; consider EMG (nerve) testing

## 2020-10-19 ENCOUNTER — Other Ambulatory Visit: Payer: Self-pay | Admitting: Family Medicine

## 2020-10-19 DIAGNOSIS — Z1231 Encounter for screening mammogram for malignant neoplasm of breast: Secondary | ICD-10-CM

## 2020-12-02 ENCOUNTER — Ambulatory Visit: Payer: 59

## 2020-12-29 ENCOUNTER — Ambulatory Visit
Admission: RE | Admit: 2020-12-29 | Discharge: 2020-12-29 | Disposition: A | Discharge status: Home / Self Care | Payer: 59 | Source: Ambulatory Visit | Attending: Family Medicine | Admitting: Family Medicine

## 2020-12-29 ENCOUNTER — Other Ambulatory Visit: Payer: Self-pay

## 2020-12-29 DIAGNOSIS — Z1231 Encounter for screening mammogram for malignant neoplasm of breast: Secondary | ICD-10-CM

## 2021-09-16 ENCOUNTER — Other Ambulatory Visit: Payer: Self-pay

## 2021-09-16 ENCOUNTER — Encounter: Payer: Self-pay | Admitting: Family Medicine

## 2021-09-16 ENCOUNTER — Ambulatory Visit (INDEPENDENT_AMBULATORY_CARE_PROVIDER_SITE_OTHER): Payer: 59 | Admitting: Family Medicine

## 2021-09-16 VITALS — BP 140/82 | HR 63 | Ht 63.0 in | Wt 137.0 lb

## 2021-09-16 DIAGNOSIS — G43009 Migraine without aura, not intractable, without status migrainosus: Secondary | ICD-10-CM | POA: Diagnosis not present

## 2021-09-16 MED ORDER — TOPIRAMATE 50 MG PO TABS: 50.0000 mg | ORAL_TABLET | Freq: Every day | ORAL | 4 refills | Status: DC

## 2021-09-16 MED ORDER — SUMATRIPTAN SUCCINATE 100 MG PO TABS: 100.0000 mg | ORAL_TABLET | Freq: Every day | ORAL | 12 refills | Status: DC | PRN

## 2021-09-16 NOTE — Patient Instructions (Addendum)
Below is our plan:  We will continue topiramate and sumatriptan   Please make sure you are staying well hydrated. I recommend 50-60 ounces daily. Well balanced diet and regular exercise encouraged. Consistent sleep schedule with 6-8 hours recommended.   Please continue follow up with care team as directed.   Follow up with me in 1 year   You may receive a survey regarding today's visit. I encourage you to leave honest feed back as I do use this information to improve patient care. Thank you for seeing me today!   Management of Memory Problems   There are some general things you can do to help manage your memory problems.  Your memory may not in fact recover, but by using techniques and strategies you will be able to manage your memory difficulties better.   1)  Establish a routine. Try to establish and then stick to a regular routine.  By doing this, you will get used to what to expect and you will reduce the need to rely on your memory.  Also, try to do things at the same time of day, such as taking your medication or checking your calendar first thing in the morning. Think about think that you can do as a part of a regular routine and make a list.  Then enter them into a daily planner to remind you.  This will help you establish a routine.   2)  Organize your environment. Organize your environment so that it is uncluttered.  Decrease visual stimulation.  Place everyday items such as keys or cell phone in the same place every day (ie.  Basket next to front door) Use post it notes with a brief message to yourself (ie. Turn off light, lock the door) Use labels to indicate where things go (ie. Which cupboards are for food, dishes, etc.) Keep a notepad and pen by the telephone to take messages   3)  Memory Aids A diary or journal/notebook/daily planner Making a list (shopping list, chore list, to do list that needs to be done) Using an alarm as a reminder (kitchen timer or cell phone  alarm) Using cell phone to store information (Notes, Calendar, Reminders) Calendar/White board placed in a prominent position Post-it notes   In order for memory aids to be useful, you need to have good habits.  It's no good remembering to make a note in your journal if you don't remember to look in it.  Try setting aside a certain time of day to look in journal.   4)  Improving mood and managing fatigue. There may be other factors that contribute to memory difficulties.  Factors, such as anxiety, depression and tiredness can affect memory. Regular gentle exercise can help improve your mood and give you more energy. Simple relaxation techniques may help relieve symptoms of anxiety Try to get back to completing activities or hobbies you enjoyed doing in the past. Learn to pace yourself through activities to decrease fatigue. Find out about some local support groups where you can share experiences with others. Try and achieve 7-8 hours of sleep at night.
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
## 2021-09-20 ENCOUNTER — Encounter: Payer: Self-pay | Admitting: Family Medicine

## 2021-10-25 ENCOUNTER — Other Ambulatory Visit: Payer: Self-pay

## 2021-10-25 ENCOUNTER — Encounter (HOSPITAL_BASED_OUTPATIENT_CLINIC_OR_DEPARTMENT_OTHER): Payer: Self-pay | Admitting: Orthopedic Surgery

## 2021-10-25 NOTE — H&P (Signed)
Preoperative History & Physical Exam  Surgeon: Philipp Ovens, MD  Diagnosis: LEFT THUMB osteoarthritis  Planned Procedure: Procedure(s) (LRB): LEFT THUMB CMC ARTHROPLASTY WITH TENDON TRANSFER (Left)  History of Present Illness:   Patient is a 60 y.o. female with symptoms consistent with  LEFT THUMB osteoarthritis who presents for surgical intervention. The risks, benefits and alternatives of surgical intervention were discussed and informed consent was obtained prior to surgery.  Past Medical History:  Past Medical History:  Diagnosis Date   Asthma    Headache    migraines   TMJ (dislocation of temporomandibular joint)     Past Surgical History:  Past Surgical History:  Procedure Laterality Date   ABDOMINAL HYSTERECTOMY     ABDOMINAL SURGERY  04/26/2016   hysterectomy- complete   APPENDECTOMY  04/26/2016   GANGLION CYST EXCISION Right 1990   ovarian mass removal  04/26/2016    Medications:  Prior to Admission medications   Medication Sig Start Date End Date Taking? Authorizing Provider  ADVAIR DISKUS 250-50 MCG/DOSE AEPB Inhale 1 puff into the lungs as needed. 03/27/13  Yes [provider]  diphenoxylate-atropine (LOMOTIL) 2.5-0.025 MG per tablet Take 1 tablet by mouth as needed for diarrhea or loose stools.   Yes [provider]  FLUoxetine (PROZAC) 20 MG capsule 20 mg daily. 02/13/18  Yes [provider]  montelukast (SINGULAIR) 10 MG tablet Take 1 tablet by mouth daily. 04/24/13  Yes [provider]  pantoprazole (PROTONIX) 40 MG tablet One pill(40mg ) 30 minutes before breakfast and one pill 30 minutes before dinner. 04/20/17  Yes [provider]  SUMAtriptan (IMITREX) 100 MG tablet Take 1 tablet (100 mg total) by mouth daily as needed for migraine (max 2 tabs per day or 8 tabs per month). 09/16/21  Yes Lomax, Amy, NP  temazepam (RESTORIL) 15 MG capsule Take 15 mg by mouth at bedtime. 05/14/13  Yes [provider]   topiramate (TOPAMAX) 50 MG tablet Take 1 tablet (50 mg total) by mouth daily. 09/16/21  Yes Lomax, Amy, NP  VENTOLIN HFA 108 (90 BASE) MCG/ACT inhaler Inhale 1 puff into the lungs as needed. 05/13/13  Yes [provider]    Allergies:  Ampicillin and Benzonatate  Review of Systems: Negative except per HPI.  Physical Exam: Alert and oriented, NAD Head and neck: no masses, normal alignment CV: pulse intact Pulm: no increased work of breathing, respirations even and unlabored Abdomen: non-distended Extremities: extremities warm and well perfused  LABS: No results found for this or any previous visit (from the past 2160 hour(s)).   Complete History and Physical exam available in the office notes  Gomez Cleverly

## 2021-10-25 NOTE — Progress Notes (Signed)
Spoke w/ via phone for pre-op interview--- [facility] needs dos----  none             Lab results------ COVID test -----patient states asymptomatic no test needed Arrive at ------- 0900 NPO after MN NO Solid Food.  Clear liquids from MN until--- 0800 Med rec completed Medications to take morning of surgery ----- Advair, Topamax, Protonix, Imitrex, Singulair, Albuterol (pt to bring) Diabetic medication ----- Patient instructed no nail polish to be worn day of surgery Patient instructed to bring photo id and insurance card day of surgery Patient aware to have Driver (ride ) / caregiver    for 24 hours after surgery  Patient Special Instructions ----- Pre-Op special Istructions ----- Patient verbalized understanding of instructions that were given at this phone interview. Patient denies shortness of breath, chest pain, fever, cough at this phone interview.

## 2021-10-28 ENCOUNTER — Ambulatory Visit (HOSPITAL_BASED_OUTPATIENT_CLINIC_OR_DEPARTMENT_OTHER)
Admission: RE | Admit: 2021-10-28 | Discharge: 2021-10-28 | Disposition: A | Discharge status: Home / Self Care | Payer: 59 | Attending: Orthopedic Surgery | Admitting: Orthopedic Surgery

## 2021-10-28 ENCOUNTER — Encounter (HOSPITAL_BASED_OUTPATIENT_CLINIC_OR_DEPARTMENT_OTHER): Payer: Self-pay | Admitting: Orthopedic Surgery

## 2021-10-28 ENCOUNTER — Ambulatory Visit (HOSPITAL_BASED_OUTPATIENT_CLINIC_OR_DEPARTMENT_OTHER): Payer: 59 | Admitting: Anesthesiology

## 2021-10-28 ENCOUNTER — Other Ambulatory Visit: Payer: Self-pay

## 2021-10-28 ENCOUNTER — Encounter (HOSPITAL_BASED_OUTPATIENT_CLINIC_OR_DEPARTMENT_OTHER)
Admission: RE | Disposition: A | Discharge status: Home / Self Care | Payer: Self-pay | Source: Home / Self Care | Attending: Orthopedic Surgery

## 2021-10-28 ENCOUNTER — Ambulatory Visit (HOSPITAL_BASED_OUTPATIENT_CLINIC_OR_DEPARTMENT_OTHER): Payer: 59

## 2021-10-28 DIAGNOSIS — J45909 Unspecified asthma, uncomplicated: Secondary | ICD-10-CM | POA: Insufficient documentation

## 2021-10-28 DIAGNOSIS — M1812 Unilateral primary osteoarthritis of first carpometacarpal joint, left hand: Secondary | ICD-10-CM

## 2021-10-28 HISTORY — DX: Unspecified asthma, uncomplicated: J45.909

## 2021-10-28 HISTORY — PX: FINGER ARTHROSCOPY WITH CARPOMETACARPEL (CMC) ARTHROPLASTY: SHX5629

## 2021-10-28 SURGERY — FINGER ARTHROSCOPY WITH CARPOMETACARPEL (CMC) ARTHROPLASTY
Anesthesia: Monitor Anesthesia Care | Site: Hand | Laterality: Left

## 2021-10-28 MED ORDER — SODIUM CHLORIDE 0.9 % IR SOLN
Status: DC | PRN
  Administered 2021-10-28: 500 mL

## 2021-10-28 MED ORDER — PROPOFOL 1000 MG/100ML IV EMUL
INTRAVENOUS | Status: AC
  Filled 2021-10-28: qty 100

## 2021-10-28 MED ORDER — DEXAMETHASONE SODIUM PHOSPHATE 10 MG/ML IJ SOLN
INTRAMUSCULAR | Status: DC | PRN
  Administered 2021-10-28: 5 mg

## 2021-10-28 MED ORDER — AMISULPRIDE (ANTIEMETIC) 5 MG/2ML IV SOLN: 10.0000 mg | Freq: Once | INTRAVENOUS | Status: DC | PRN

## 2021-10-28 MED ORDER — ROPIVACAINE HCL 5 MG/ML IJ SOLN
INTRAMUSCULAR | Status: DC | PRN
  Administered 2021-10-28: 30 mL via PERINEURAL

## 2021-10-28 MED ORDER — LACTATED RINGERS IV SOLN: INTRAVENOUS | Status: DC

## 2021-10-28 MED ORDER — CEFAZOLIN SODIUM-DEXTROSE 2-4 GM/100ML-% IV SOLN
INTRAVENOUS | Status: AC
  Filled 2021-10-28: qty 100

## 2021-10-28 MED ORDER — FENTANYL CITRATE (PF) 100 MCG/2ML IJ SOLN
INTRAMUSCULAR | Status: AC
  Filled 2021-10-28: qty 2

## 2021-10-28 MED ORDER — PROPOFOL 10 MG/ML IV BOLUS
INTRAVENOUS | Status: DC | PRN
  Administered 2021-10-28: 20 mg via INTRAVENOUS

## 2021-10-28 MED ORDER — OXYCODONE HCL 5 MG/5ML PO SOLN: 5.0000 mg | Freq: Once | ORAL | Status: DC | PRN

## 2021-10-28 MED ORDER — ONDANSETRON HCL 4 MG/2ML IJ SOLN: 4.0000 mg | Freq: Once | INTRAMUSCULAR | Status: DC | PRN

## 2021-10-28 MED ORDER — FENTANYL CITRATE (PF) 100 MCG/2ML IJ SOLN: 25.0000 ug | INTRAMUSCULAR | Status: DC | PRN

## 2021-10-28 MED ORDER — ONDANSETRON HCL 4 MG/2ML IJ SOLN
INTRAMUSCULAR | Status: DC | PRN
  Administered 2021-10-28: 4 mg via INTRAVENOUS

## 2021-10-28 MED ORDER — MIDAZOLAM HCL 2 MG/2ML IJ SOLN
INTRAMUSCULAR | Status: AC
  Filled 2021-10-28: qty 2

## 2021-10-28 MED ORDER — OXYCODONE-ACETAMINOPHEN 5-325 MG PO TABS: 1.0000 | ORAL_TABLET | Freq: Four times a day (QID) | ORAL | 0 refills | Status: AC | PRN

## 2021-10-28 MED ORDER — ONDANSETRON HCL 4 MG/2ML IJ SOLN
INTRAMUSCULAR | Status: AC
  Filled 2021-10-28: qty 2

## 2021-10-28 MED ORDER — OXYCODONE HCL 5 MG PO TABS: 5.0000 mg | ORAL_TABLET | Freq: Once | ORAL | Status: DC | PRN

## 2021-10-28 MED ORDER — CEFAZOLIN SODIUM-DEXTROSE 2-4 GM/100ML-% IV SOLN
2.0000 g | INTRAVENOUS | Status: AC
  Administered 2021-10-28: 2 g via INTRAVENOUS

## 2021-10-28 MED ORDER — FENTANYL CITRATE (PF) 100 MCG/2ML IJ SOLN
25.0000 ug | Freq: Once | INTRAMUSCULAR | Status: AC
  Administered 2021-10-28 (×2): 50 ug via INTRAVENOUS

## 2021-10-28 MED ORDER — PROPOFOL 500 MG/50ML IV EMUL
INTRAVENOUS | Status: DC | PRN
  Administered 2021-10-28: 100 ug/kg/min via INTRAVENOUS

## 2021-10-28 MED ORDER — MIDAZOLAM HCL 2 MG/2ML IJ SOLN
2.0000 mg | Freq: Once | INTRAMUSCULAR | Status: AC
  Administered 2021-10-28: 2 mg via INTRAVENOUS

## 2021-10-28 SURGICAL SUPPLY — 46 items
BLADE SURG 15 STRL LF DISP TIS (BLADE) ×2 IMPLANT
BLADE SURG 15 STRL SS (BLADE) ×4
BNDG CMPR 9X4 STRL LF SNTH (GAUZE/BANDAGES/DRESSINGS) ×1
BNDG ELASTIC 4X5.8 VLCR STR LF (GAUZE/BANDAGES/DRESSINGS) ×3 IMPLANT
BNDG ESMARK 4X9 LF (GAUZE/BANDAGES/DRESSINGS) ×2 IMPLANT
BNDG PLASTER X FAST 3X3 WHT LF (CAST SUPPLIES) ×1 IMPLANT
BNDG PLSTR 9X3 FST ST WHT (CAST SUPPLIES) ×1
CORD BIPOLAR FORCEPS 12FT (ELECTRODE) ×2 IMPLANT
COVER BACK TABLE 60X90IN (DRAPES) ×2 IMPLANT
CUFF TOURN SGL QUICK 18X4 (TOURNIQUET CUFF) ×2 IMPLANT
DECANTER SPIKE VIAL GLASS SM (MISCELLANEOUS) IMPLANT
DRAPE EXTREMITY T 121X128X90 (DISPOSABLE) ×2 IMPLANT
DRAPE OEC MINIVIEW 54X84 (DRAPES) ×2 IMPLANT
DRAPE SHEET LG 3/4 BI-LAMINATE (DRAPES) ×2 IMPLANT
DRAPE SURG 17X23 STRL (DRAPES) ×2 IMPLANT
GAUZE 4X4 16PLY ~~LOC~~+RFID DBL (SPONGE) ×2 IMPLANT
GAUZE SPONGE 4X4 12PLY STRL (GAUZE/BANDAGES/DRESSINGS) ×2 IMPLANT
GAUZE SPONGE 4X4 12PLY STRL LF (GAUZE/BANDAGES/DRESSINGS) ×1 IMPLANT
GAUZE XEROFORM 1X8 LF (GAUZE/BANDAGES/DRESSINGS) ×2 IMPLANT
GLOVE SURG ENC MOIS LTX SZ7.5 (GLOVE) ×2 IMPLANT
GOWN STRL REUS W/TWL LRG LVL3 (GOWN DISPOSABLE) ×2 IMPLANT
K-WIRE DBL END TROCAR 6X.062 (WIRE)
K-WIRE THREAD TROCAR TIP .045 (WIRE) ×2
KWIRE DBL END TROCAR 6X.062 (WIRE) IMPLANT
KWIRE THREAD TROCAR TIP .045 (WIRE) IMPLANT
NDL HYPO 25X1 1.5 SAFETY (NEEDLE) ×1 IMPLANT
NEEDLE HYPO 25X1 1.5 SAFETY (NEEDLE) ×2 IMPLANT
NS IRRIG 1000ML POUR BTL (IV SOLUTION) ×2 IMPLANT
PACK BASIN DAY SURGERY FS (CUSTOM PROCEDURE TRAY) ×2 IMPLANT
PAD CAST 4YDX4 CTTN HI CHSV (CAST SUPPLIES) ×1 IMPLANT
PADDING CAST COTTON 4X4 STRL (CAST SUPPLIES) ×2
SLING ARM FOAM STRAP MED (SOFTGOODS) ×1 IMPLANT
SUCTION FRAZIER HANDLE 10FR (MISCELLANEOUS)
SUCTION TUBE FRAZIER 10FR DISP (MISCELLANEOUS) IMPLANT
SUT ETHILON 4 0 PS 2 18 (SUTURE) ×2 IMPLANT
SUT ORTHOCORD W/MULTIPK NDL (SUTURE) ×1 IMPLANT
SUT VIC AB 3-0 PS2 18 (SUTURE)
SUT VIC AB 3-0 PS2 18XBRD (SUTURE) IMPLANT
SUT VIC AB 3-0 SH 27 (SUTURE)
SUT VIC AB 3-0 SH 27X BRD (SUTURE) IMPLANT
SUT VIC AB 4-0 PS2 18 (SUTURE) ×1 IMPLANT
SYR 10ML LL (SYRINGE) ×2 IMPLANT
SYR BULB EAR ULCER 3OZ GRN STR (SYRINGE) ×2 IMPLANT
TOWEL OR 17X26 10 PK STRL BLUE (TOWEL DISPOSABLE) ×2 IMPLANT
TRAY DSU PREP LF (CUSTOM PROCEDURE TRAY) ×2 IMPLANT
UNDERPAD 30X36 HEAVY ABSORB (UNDERPADS AND DIAPERS) ×2 IMPLANT

## 2021-10-28 NOTE — Op Note (Signed)
OPERATIVE NOTE  DATE OF PROCEDURE: 10/28/2021  SURGEONS:  Primary: Orene Desanctis, MD  PREOPERATIVE DIAGNOSIS: LEFT THUMB [place] joint osteoarthritis  POSTOPERATIVE DIAGNOSIS: Same  NAME OF PROCEDURE:   LEFT thumb CMC arthroplasty, trapeziectomy LEFT thumb flexor carpi radialis tendon to abductor pollicis longus tendon transfer at level of [place] joint Four view radiographs of the LEFT thumb and hand with intra-operative interpretation  ANESTHESIA: Monitor Anesthesia Care + Block  SKIN PREPARATION: Hibiclens  ESTIMATED BLOOD LOSS: Minimal  IMPLANTS: none  INDICATIONS:  Alexa Beard is a 60 y.o. female who has the above preoperative diagnosis. The patient has decided to proceed with surgical intervention.  Risks, benefits and alternatives of operative management were discussed including, but not limited to, risks of anesthesia complications, infection, pain, persistent symptoms, stiffness, need for future surgery.  The patient understands, agrees and elects to proceed with surgery.    DESCRIPTION OF PROCEDURE: The patient was met in the pre-operative area and their identity was verified.  The operative location and laterality was also verified and marked.  The patient was brought to the OR and was placed supine on the table.  After repeat patient identification with the operative team anesthesia was provided and the patient was prepped and draped in the usual sterile fashion.  A final timeout was performed verifying the correction patient, procedure, location and laterality. Preoperative IV antibiotics were provided.  The left upper extremity was exsanguinated with an esmarch and forearm tourniquet inflated to 248mHg. A Wagner approach between the APL and APB was performed. Skin and subcutaneous tissues were divided. Branches of the radial sensory nerve were mobilized and protected. The APL and thenar eminence muscle interval was incised sharply down to bone taking care to leave the APL insertion  attached to the base of the first metacarpal. The CMC and STT joint were identified and the capsule was incised circumferentially around the trapezium. A K-wire was placed in the trapezium and c-arm fluorscopy was used to confirm carpectomy of this bone. A combination of sharp dissection with 15 blade and Mcglamry elevator was used to removed the trapezium en bloc. The trapezium was inspected and there was significant degeneration of the joint surface with eburnation and sclerosis. Attention was then turned to the scaphotrapezoid articulation and there was preserved joint space.  Via digital palpation and c-arm fluoroscopy shots, the entire trapezium and bone fragments were confirmed to be removed. After carpectomy, attention was turned to the APL to FCR suspension arthroplasty tendon transfer. The insertion of the FCR tendon and APL tendon was identified. A #2 orthocord suture was utilized to secure the transfer in locking fashion and tied deep within the wound via square knots. The thumb metacarpal was suspended to the level of the second metacarpal with excellent space between the thumb metacarpal and scaphoid. There was full opposition, circumduction of the thumb after transfer. The wound was thoroughly irrigated with normal saline. The capsule and thenar muscle was reapproximated with multiple interrupted 4-0 vicryl suture and the skin was closed with 4-0 nylon horizontal mattress sutures. A sterile soft dressing and thumb spica splint was applied. The tourniquet was deflated and fingers were pink, warm, and well perfused at the end of the case. All counts were correct x 2. The patient tolerated the procedure well and was brought to PACU for recovery.    JMatt Holmes MD

## 2021-10-28 NOTE — Anesthesia Postprocedure Evaluation (Signed)
Anesthesia Post Note  Patient: Laronda Lisby  Procedure(s) Performed: LEFT THUMB CMC ARTHROPLASTY WITH TENDON TRANSFER (Left: Hand)     Patient location during evaluation: PACU Anesthesia Type: Regional Level of consciousness: awake and alert Pain management: pain level controlled Vital Signs Assessment: post-procedure vital signs reviewed and stable Respiratory status: spontaneous breathing, nonlabored ventilation and respiratory function stable Cardiovascular status: blood pressure returned to baseline and stable Postop Assessment: no apparent nausea or vomiting Anesthetic complications: no   No notable events documented.  Last Vitals:  Vitals:   10/28/21 1133 10/28/21 1225  BP: 107/78 116/72  Pulse: 82 82  Resp: 16 14  Temp: 36.7 C 36.4 C  SpO2: 95% 96%    Last Pain:  Vitals:   10/28/21 1225  TempSrc:   PainSc: 0-No pain                 Lucretia Kern

## 2021-10-28 NOTE — Discharge Instructions (Addendum)
Orthopaedic Hand Surgery Discharge Instructions  WEIGHT BEARING STATUS: Non weight bearing on operative extremity  DRESSINGS: Please keep your dressing/splint/cast clean and dry until your follow-up appointment. You may shower by placing a waterproof covering over your dressing/splint/cast. Contact your surgeon if your splint/cast gets wet. It will need to be changed to prevent skin breakdown.  PAIN CONTROL: First line medications for post operative pain control are Tylenol (acetaminophen) and Motrin (ibuprofen) if you are able to take these medications. If you have been prescribed a medication these can be taken as breakthrough pain medications. Please note that some narcotic pain medication have acetaminophen added and you should never consume more than 4,000mg  of acetaminophen in 24 hour period. Also please note that if you are given Toradol (ketoralac) you should not take similar medications simultaneously such as ibuprofen.   ICE/ELEVATION: Ice and elevate your injured extremity as needed. Avoid direct contact of ice with skin.  HOME MEDICATIONS: No changes have been made to your home medications.  FOLLOW UP: You will be called after surgery with an appointment date and time, however if you have not received a phone call within 3 days please call during regular office hours at [phone] to schedule a post operative appointment.  Please Seek Medical Attention if: Call MD for: pain or pressure in chest, jaw, arm, back, neck  Call MD for: temperature greater than 101 F for more than 24 hours  Call MD for: difficulty breathing Call MD for: Incision redness, bleeding, drainage  Call MD for: palpitations or feeling that the heart is racing  Call MD for: increased swelling in arm, leg, ankle, or abdomen  Call MD for: lightheadedness, dizziness, fainting Go to ED or call 911 if: chest pain does not go away after 3 nitroglycerin doses taken 5 min apart  Go to ED or call 911 for: any  uncontrolled bleeding  Go to ED or call 911 if: unable to reach physician  Discharge Medications: Allergies as of 10/28/2021       Reactions   Ampicillin Hives   Benzonatate Itching        Medication List     TAKE these medications    Advair Diskus 250-50 MCG/ACT Aepb Generic drug: fluticasone-salmeterol Inhale 1 puff into the lungs as needed.   B-12 PO Take by mouth.   diphenoxylate-atropine 2.5-0.025 MG tablet Commonly known as: LOMOTIL Take 1 tablet by mouth as needed for diarrhea or loose stools.   FLUoxetine 20 MG capsule Commonly known as: PROZAC 20 mg daily.   ibuprofen 200 MG tablet Commonly known as: ADVIL Take 400 mg by mouth every 6 (six) hours as needed.   montelukast 10 MG tablet Commonly known as: SINGULAIR Take 1 tablet by mouth daily.   oxyCODONE-acetaminophen 5-325 MG tablet Commonly known as: Percocet Take 1 tablet by mouth every 6 (six) hours as needed for up to 5 days for severe pain.   pantoprazole 40 MG tablet Commonly known as: PROTONIX One pill(40mg ) 30 minutes before breakfast and one pill 30 minutes before dinner.   SUMAtriptan 100 MG tablet Commonly known as: IMITREX Take 1 tablet (100 mg total) by mouth daily as needed for migraine (max 2 tabs per day or 8 tabs per month).   temazepam 15 MG capsule Commonly known as: RESTORIL Take 15 mg by mouth at bedtime.   topiramate 50 MG tablet Commonly known as: TOPAMAX Take 1 tablet (50 mg total) by mouth daily.   Ventolin HFA 108 (90 Base) MCG/ACT inhaler Generic  drug: albuterol Inhale 1 puff into the lungs as needed.          Alexa Dad, MD Orthopaedic Hand Surgeon [facility] Office number: [phone] [address] [place], [place] [address]     Regional Anesthesia Blocks  1. Numbness or the inability to move the "blocked" extremity may last from 3-48 hours after placement. The length of time depends on the medication injected and your individual  response to the medication. If the numbness is not going away after 48 hours, call your surgeon.  2. The extremity that is blocked will need to be protected until the numbness is gone and the  Strength has returned. Because you cannot feel it, you will need to take extra care to avoid injury. Because it may be weak, you may have difficulty moving it or using it. You may not know what position it is in without looking at it while the block is in effect.  3. For blocks in the legs and feet, returning to weight bearing and walking needs to be done carefully. You will need to wait until the numbness is entirely gone and the strength has returned. You should be able to move your leg and foot normally before you try and bear weight or walk. You will need someone to be with you when you first try to ensure you do not fall and possibly risk injury.  4. Bruising and tenderness at the needle site are common side effects and will resolve in a few days.  5. Persistent numbness or new problems with movement should be communicated to the surgeon or the [facility] [phone] [facility] [phone]).      Post Anesthesia Home Care Instructions  Activity: Get plenty of rest for the remainder of the day. A responsible individual must stay with you for 24 hours following the procedure.  For the next 24 hours, DO NOT: -Drive a car -Advertising copywriter -Drink alcoholic beverages -Take any medication unless instructed by your physician -Make any legal decisions or sign important papers.  Meals: Start with liquid foods such as gelatin or soup. Progress to regular foods as tolerated. Avoid greasy, spicy, heavy foods. If nausea and/or vomiting occur, drink only clear liquids until the nausea and/or vomiting subsides. Call your physician if vomiting continues.  Special Instructions/Symptoms: Your throat may feel dry or sore from the anesthesia or the breathing tube placed in your  throat during surgery. If this causes discomfort, gargle with warm salt water. The discomfort should disappear within 24 hours.

## 2021-10-28 NOTE — Interval H&P Note (Signed)
History and Physical Interval Note:  10/28/2021 10:11 AM  Alexa Beard  has presented today for surgery, with the diagnosis of LEFT THUMB osteoarthritis.  The various methods of treatment have been discussed with the patient and family. After consideration of risks, benefits and other options for treatment, the patient has consented to  Procedure(s): LEFT THUMB CMC ARTHROPLASTY WITH TENDON TRANSFER (Left) as a surgical intervention.  The patient's history has been reviewed, patient examined, no change in status, stable for surgery.  I have reviewed the patient's chart and labs.  Questions were answered to the patient's satisfaction.     Gomez Cleverly

## 2021-10-28 NOTE — Transfer of Care (Signed)
Immediate Anesthesia Transfer of Care Note  Patient: Alexa Beard  Procedure(s) Performed: LEFT THUMB CMC ARTHROPLASTY WITH TENDON TRANSFER (Left: Hand)  Patient Location: PACU  Anesthesia Type:MAC and Regional  Level of Consciousness: awake, alert , oriented and patient cooperative  Airway & Oxygen Therapy: Patient Spontanous Breathing  Post-op Assessment: Report given to RN and Post -op Vital signs reviewed and stable  Post vital signs: Reviewed and stable  Last Vitals:  Vitals Value Taken Time  BP    Temp    Pulse    Resp    SpO2      Last Pain:  Vitals:   10/28/21 0921  TempSrc: Oral  PainSc: 9          Complications: No notable events documented.

## 2021-10-28 NOTE — Anesthesia Preprocedure Evaluation (Signed)
Anesthesia Evaluation  Patient identified by MRN, date of birth, ID band Patient awake    Reviewed: Allergy & Precautions, NPO status , Patient's Chart, lab work & pertinent test results  History of Anesthesia Complications Negative for: history of anesthetic complications  Airway Mallampati: II  TM Distance: >3 FB Neck ROM: Full    Dental   Pulmonary asthma ,    Pulmonary exam normal        Cardiovascular negative cardio ROS Normal cardiovascular exam     Neuro/Psych  Headaches,    GI/Hepatic negative GI ROS, Neg liver ROS,   Endo/Other  negative endocrine ROS  Renal/GU negative Renal ROS  negative genitourinary   Musculoskeletal negative musculoskeletal ROS (+)   Abdominal   Peds  Hematology negative hematology ROS (+)   Anesthesia Other Findings   Reproductive/Obstetrics                             Anesthesia Physical Anesthesia Plan  ASA: 2  Anesthesia Plan: MAC and Regional   Post-op Pain Management: Regional block   Induction: Intravenous  PONV Risk Score and Plan: 2 and Propofol infusion, TIVA and Treatment may vary due to age or medical condition  Airway Management Planned: Natural Airway, Nasal Cannula and Simple Face Mask  Additional Equipment: None  Intra-op Plan:   Post-operative Plan:   Informed Consent: I have reviewed the patients History and Physical, chart, labs and discussed the procedure including the risks, benefits and alternatives for the proposed anesthesia with the patient or authorized representative who has indicated his/her understanding and acceptance.       Plan Discussed with:   Anesthesia Plan Comments:         Anesthesia Quick Evaluation

## 2021-10-28 NOTE — Anesthesia Procedure Notes (Signed)
Anesthesia Regional Block: Supraclavicular block   Pre-Anesthetic Checklist: , timeout performed,  Correct Patient, Correct Site, Correct Laterality,  Correct Procedure, Correct Position, site marked,  Risks and benefits discussed,  Surgical consent,  Pre-op evaluation,  At surgeon's request and post-op pain management  Laterality: Left  Prep: chloraprep       Needles:  Injection technique: Single-shot  Needle Type: Echogenic Stimulator Needle     Needle Length: 10cm  Needle Gauge: 20     Additional Needles:   Procedures:,,,, ultrasound used (permanent image in chart),,    Narrative:  Start time: 10/28/2021 9:43 AM End time: 10/28/2021 9:47 AM Injection made incrementally with aspirations every 5 mL.  Performed by: Personally  Anesthesiologist: Lucretia Kern, MD  Additional Notes: Standard monitors applied. Skin prepped. Good needle visualization with ultrasound. Injection made in 5cc increments with no resistance to injection. Patient tolerated the procedure well.

## 2021-10-28 NOTE — Progress Notes (Signed)
Assisted Dr. Witman with left, ultrasound guided, supraclavicular block. Side rails up, monitors on throughout procedure. See vital signs in flow sheet. Tolerated Procedure well. °

## 2021-10-29 ENCOUNTER — Encounter (HOSPITAL_BASED_OUTPATIENT_CLINIC_OR_DEPARTMENT_OTHER): Payer: Self-pay | Admitting: Orthopedic Surgery

## 2021-11-22 ENCOUNTER — Other Ambulatory Visit: Payer: Self-pay | Admitting: Family Medicine

## 2021-11-22 DIAGNOSIS — Z1231 Encounter for screening mammogram for malignant neoplasm of breast: Secondary | ICD-10-CM

## 2022-01-06 ENCOUNTER — Ambulatory Visit
Admission: RE | Admit: 2022-01-06 | Discharge: 2022-01-06 | Disposition: A | Discharge status: Home / Self Care | Payer: 59 | Source: Ambulatory Visit | Attending: Family Medicine | Admitting: Family Medicine

## 2022-01-06 DIAGNOSIS — Z1231 Encounter for screening mammogram for malignant neoplasm of breast: Secondary | ICD-10-CM

## 2022-09-22 ENCOUNTER — Encounter: Payer: Self-pay | Admitting: Family Medicine

## 2022-09-22 ENCOUNTER — Ambulatory Visit: Payer: 59 | Admitting: Family Medicine

## 2022-09-22 VITALS — BP 142/84 | HR 70 | Ht 63.0 in | Wt 142.5 lb

## 2022-09-22 DIAGNOSIS — G43009 Migraine without aura, not intractable, without status migrainosus: Secondary | ICD-10-CM | POA: Diagnosis not present

## 2022-09-22 MED ORDER — SUMATRIPTAN SUCCINATE 100 MG PO TABS: 100.0000 mg | ORAL_TABLET | Freq: Every day | ORAL | 12 refills | Status: DC | PRN

## 2022-09-22 MED ORDER — TOPIRAMATE 50 MG PO TABS: 50.0000 mg | ORAL_TABLET | Freq: Every day | ORAL | 4 refills | Status: DC

## 2022-09-22 NOTE — Patient Instructions (Signed)
Below is our plan:  We will continue topiramate 50mg  daily. May consider weaning in the future if you wish. Continue Sumatriptan 100mg  as needed   Please make sure you are staying well hydrated. I recommend 50-60 ounces daily. Well balanced diet and regular exercise encouraged. Consistent sleep schedule with 6-8 hours recommended.   Please continue follow up with care team as directed.   Follow up with me in 1 year   You may receive a survey regarding today's visit. I encourage you to leave honest feed back as I do use this information to improve patient care. Thank you for seeing me today!
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
## 2022-10-18 DIAGNOSIS — E7800 Pure hypercholesterolemia, unspecified: Secondary | ICD-10-CM | POA: Diagnosis not present

## 2022-10-18 DIAGNOSIS — K5190 Ulcerative colitis, unspecified, without complications: Secondary | ICD-10-CM | POA: Diagnosis not present

## 2022-12-13 ENCOUNTER — Other Ambulatory Visit: Payer: Self-pay | Admitting: Family Medicine

## 2022-12-13 DIAGNOSIS — Z1231 Encounter for screening mammogram for malignant neoplasm of breast: Secondary | ICD-10-CM

## 2023-01-17 ENCOUNTER — Ambulatory Visit
Admission: RE | Admit: 2023-01-17 | Discharge: 2023-01-17 | Disposition: A | Discharge status: Home / Self Care | Payer: 59 | Source: Ambulatory Visit | Attending: Family Medicine | Admitting: Family Medicine

## 2023-01-17 DIAGNOSIS — Z1231 Encounter for screening mammogram for malignant neoplasm of breast: Secondary | ICD-10-CM

## 2023-01-17 DIAGNOSIS — Z23 Encounter for immunization: Secondary | ICD-10-CM | POA: Diagnosis not present

## 2023-01-18 DIAGNOSIS — M25561 Pain in right knee: Secondary | ICD-10-CM | POA: Diagnosis not present

## 2023-01-26 DIAGNOSIS — D128 Benign neoplasm of rectum: Secondary | ICD-10-CM | POA: Diagnosis not present

## 2023-01-26 DIAGNOSIS — K5100 Ulcerative (chronic) pancolitis without complications: Secondary | ICD-10-CM | POA: Diagnosis not present

## 2023-01-26 DIAGNOSIS — K5730 Diverticulosis of large intestine without perforation or abscess without bleeding: Secondary | ICD-10-CM | POA: Diagnosis not present

## 2023-01-26 DIAGNOSIS — D120 Benign neoplasm of cecum: Secondary | ICD-10-CM | POA: Diagnosis not present

## 2023-01-26 DIAGNOSIS — Z1211 Encounter for screening for malignant neoplasm of colon: Secondary | ICD-10-CM | POA: Diagnosis not present

## 2023-01-31 DIAGNOSIS — M25561 Pain in right knee: Secondary | ICD-10-CM | POA: Diagnosis not present

## 2023-02-07 DIAGNOSIS — M25561 Pain in right knee: Secondary | ICD-10-CM | POA: Diagnosis not present

## 2023-02-08 ENCOUNTER — Telehealth: Payer: Self-pay

## 2023-02-08 NOTE — Telephone Encounter (Signed)
Received clearance forms from [employer]. Will places forms in Dr Leta Baptist in basket.

## 2023-02-27 DIAGNOSIS — G8918 Other acute postprocedural pain: Secondary | ICD-10-CM | POA: Diagnosis not present

## 2023-02-27 DIAGNOSIS — X58XXXA Exposure to other specified factors, initial encounter: Secondary | ICD-10-CM | POA: Diagnosis not present

## 2023-02-27 DIAGNOSIS — M1711 Unilateral primary osteoarthritis, right knee: Secondary | ICD-10-CM | POA: Diagnosis not present

## 2023-02-27 DIAGNOSIS — M94261 Chondromalacia, right knee: Secondary | ICD-10-CM | POA: Diagnosis not present

## 2023-02-27 DIAGNOSIS — M659 Synovitis and tenosynovitis, unspecified: Secondary | ICD-10-CM | POA: Diagnosis not present

## 2023-02-27 DIAGNOSIS — M948X6 Other specified disorders of cartilage, lower leg: Secondary | ICD-10-CM | POA: Diagnosis not present

## 2023-02-27 DIAGNOSIS — Y999 Unspecified external cause status: Secondary | ICD-10-CM | POA: Diagnosis not present

## 2023-02-27 DIAGNOSIS — S83241A Other tear of medial meniscus, current injury, right knee, initial encounter: Secondary | ICD-10-CM | POA: Diagnosis not present

## 2023-04-19 DIAGNOSIS — M25561 Pain in right knee: Secondary | ICD-10-CM | POA: Diagnosis not present

## 2023-09-01 DIAGNOSIS — E7800 Pure hypercholesterolemia, unspecified: Secondary | ICD-10-CM | POA: Diagnosis not present

## 2023-09-01 DIAGNOSIS — G43909 Migraine, unspecified, not intractable, without status migrainosus: Secondary | ICD-10-CM | POA: Diagnosis not present

## 2023-09-01 DIAGNOSIS — Z23 Encounter for immunization: Secondary | ICD-10-CM | POA: Diagnosis not present

## 2023-09-01 DIAGNOSIS — J4540 Moderate persistent asthma, uncomplicated: Secondary | ICD-10-CM | POA: Diagnosis not present

## 2023-09-01 DIAGNOSIS — F322 Major depressive disorder, single episode, severe without psychotic features: Secondary | ICD-10-CM | POA: Diagnosis not present

## 2023-09-01 DIAGNOSIS — G4700 Insomnia, unspecified: Secondary | ICD-10-CM | POA: Diagnosis not present
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
## 2023-09-20 NOTE — Patient Instructions (Signed)
Below is our plan:  We will continue topiramate 50mg  daily and sumatriptan as needed  Please make sure you are staying well hydrated. I recommend 50-60 ounces daily. Well balanced diet and regular exercise encouraged. Consistent sleep schedule with 6-8 hours recommended.   Please continue follow up with care team as directed.   Follow up with me in 1 year   You may receive a survey regarding today's visit. I encourage you to leave honest feed back as I do use this information to improve patient care. Thank you for seeing me today!   GENERAL HEADACHE INFORMATION:   Natural supplements: Magnesium Oxide or Magnesium Glycinate 500 mg at bed (up to 800 mg daily) Coenzyme Q10 300 mg in AM Vitamin B2- 200 mg twice a day   Add 1 supplement at a time since even natural supplements can have undesirable side effects. You can sometimes buy supplements cheaper (especially Coenzyme Q10) at www.WebmailGuide.co.za or at [facility].  Migraine with aura: There is increased risk for stroke in women with migraine with aura and a contraindication for the combined contraceptive pill for use by women who have migraine with aura. The risk for women with migraine without aura is lower. However other risk factors like smoking are far more likely to increase stroke risk than migraine. There is a recommendation for no smoking and for the use of OCPs without estrogen such as progestogen only pills particularly for women with migraine with aura.Marland Kitchen People who have migraine headaches with auras may be 3 times more likely to have a stroke caused by a blood clot, compared to migraine patients who don't see auras. Women who take hormone-replacement therapy may be 30 percent more likely to suffer a clot-based stroke than women not taking medication containing estrogen. Other risk factors like smoking and high blood pressure may be  much more important.    Vitamins and herbs that show potential:   Magnesium: Magnesium (250 mg twice a day  or 500 mg at bed) has a relaxant effect on smooth muscles such as blood vessels. Individuals suffering from frequent or daily headache usually have low magnesium levels which can be increase with daily supplementation of 400-750 mg. Three trials found 40-90% average headache reduction  when used as a preventative. Magnesium may help with headaches are aura, the best evidence for magnesium is for migraine with aura is its thought to stop the cortical spreading depression we believe is the pathophysiology of migraine aura.Magnesium also demonstrated the benefit in menstrually related migraine.  Magnesium is part of the messenger system in the serotonin cascade and it is a good muscle relaxant.  It is also useful for constipation which can be a side effect of other medications used to treat migraine. Good sources include nuts, whole grains, and tomatoes. Side Effects: loose stool/diarrhea  Riboflavin (vitamin B 2) 200 mg twice a day. This vitamin assists nerve cells in the production of ATP a principal energy storing molecule.  It is necessary for many chemical reactions in the body.  There have been at least 3 clinical trials of riboflavin using 400 mg per day all of which suggested that migraine frequency can be decreased.  All 3 trials showed significant improvement in over half of migraine sufferers.  The supplement is found in bread, cereal, milk, meat, and poultry.  Most Americans get more riboflavin than the recommended daily allowance, however riboflavin deficiency is not necessary for the supplements to help prevent headache. Side effects: energizing, green urine   Coenzyme  Q10: This is present in almost all cells in the body and is critical component for the conversion of energy.  Recent studies have shown that a nutritional supplement of CoQ10 can reduce the frequency of migraine attacks by improving the energy production of cells as with riboflavin.  Doses of 150 mg twice a day have been shown to be  effective.   Melatonin: Increasing evidence shows correlation between melatonin secretion and headache conditions.  Melatonin supplementation has decreased headache intensity and duration.  It is widely used as a sleep aid.  Sleep is natures way of dealing with migraine.  A dose of 3 mg is recommended to start for headaches including cluster headache. Higher doses up to 15 mg has been reviewed for use in Cluster headache and have been used. The rationale behind using melatonin for cluster is that many theories regarding the cause of Cluster headache center around the disruption of the normal circadian rhythm in the brain.  This helps restore the normal circadian rhythm.   HEADACHE DIET: Foods and beverages which may trigger migraine Note that only 20% of headache patients are food sensitive. You will know if you are food sensitive if you get a headache consistently 20 minutes to 2 hours after eating a certain food. Only cut out a food if it causes headaches, otherwise you might remove foods you enjoy! What matters most for diet is to eat a well balanced healthy diet full of vegetables and low fat protein, and to not miss meals.   Chocolate, other sweets ALL cheeses except cottage and cream cheese Dairy products, yogurt, sour cream, ice cream Liver Meat extracts (Bovril, Marmite, meat tenderizers) Meats or fish which have undergone aging, fermenting, pickling or smoking. These include: Hotdogs,salami,Lox,sausage, mortadellas,smoked salmon, pepperoni, Pickled herring Pods of broad bean (English beans, Chinese pea pods, [place] (fava) beans, lima and navy beans Ripe avocado, ripe banana Yeast extracts or active yeast preparations such as Brewer's or Fleishman's (commercial bakes goods are permitted) Tomato based foods, pizza (lasagna, etc.)   MSG (monosodium glutamate) is disguised as many things; look for these common aliases: Monopotassium glutamate Autolysed yeast Hydrolysed protein Sodium  caseinate "flavorings" "all natural preservatives" Nutrasweet   Avoid all other foods that convincingly provoke headaches.   Resources: The Dizzy [place]air Laundry Your Headache Diet, migrainestrong.com  https://zamora-andrews.com/   Caffeine and Migraine For patients that have migraine, caffeine intake more than 3 days per week can lead to dependency and increased migraine frequency. I would recommend cutting back on your caffeine intake as best you can. The recommended amount of caffeine is 200-300 mg daily, although migraine patients may experience dependency at even lower doses. While you may notice an increase in headache temporarily, cutting back will be helpful for headaches in the long run. For more information on caffeine and migraine, visit: https://americanmigrainefoundation.org/resource-library/caffeine-and-migraine/   Headache Prevention Strategies:   1. Maintain a headache diary; learn to identify and avoid triggers.  - This can be a simple note where you log when you had a headache, associated symptoms, and medications used - There are several smartphone apps developed to help track migraines: Migraine Buddy, Migraine Monitor, Curelator N1-Headache App   Common triggers include: Emotional triggers: Emotional/Upset family or friends Emotional/Upset occupation Business reversal/success Anticipation anxiety Crisis-serious Post-crisis periodNew job/position   Physical triggers: Vacation Day Weekend Strenuous Exercise High Altitude Location New Move Menstrual Day Physical Illness Oversleep/Not enough sleep Weather changes Light: Photophobia or light sesnitivity treatment involves a balance between desensitization and reduction in  overly strong input. Use dark polarized glasses outside, but not inside. Avoid bright or fluorescent light, but do not dim environment to the point that going into a normally lit room hurts. Consider  FL-41 tint lenses, which reduce the most irritating wavelengths without blocking too much light.  These can be obtained at axonoptics.com or theraspecs.com Foods: see list above.   2. Limit use of acute treatments (over-the-counter medications, triptans, etc.) to no more than 2 days per week or 10 days per month to prevent medication overuse headache (rebound headache).     3. Follow a regular schedule (including weekends and holidays): Don't skip meals. Eat a balanced diet. 8 hours of sleep nightly. Minimize stress. Exercise 30 minutes per day. Being overweight is associated with a 5 times increased risk of chronic migraine. Keep well hydrated and drink 6-8 glasses of water per day.   4. Initiate non-pharmacologic measures at the earliest onset of your headache. Rest and quiet environment. Relax and reduce stress. Breathe2Relax is a free app that can instruct you on    some simple relaxtion and breathing techniques. Http://Dawnbuse.com is a    free website that provides teaching videos on relaxation.  Also, there are  many apps that   can be downloaded for "mindful" relaxation.  An app called YOGA NIDRA will help walk you through mindfulness. Another app called Calm can be downloaded to give you a structured mindfulness guide with daily reminders and skill development. Headspace for guided meditation Mindfulness Based Stress Reduction Online Course: www.palousemindfulness.com Cold compresses.   5. Don't wait!! Take the maximum allowable dosage of prescribed medication at the first sign of migraine.   6. Compliance:  Take prescribed medication regularly as directed and at the first sign of a migraine.   7. Communicate:  Call your physician when problems arise, especially if your headaches change, increase in frequency/severity, or become associated with neurological symptoms (weakness, numbness, slurred speech, etc.). Proceed to emergency room if you experience new or worsening symptoms or  symptoms do not resolve, if you have new neurologic symptoms or if headache is severe, or for any concerning symptom.   8. Headache/pain management therapies: Consider various complementary methods, including medication, behavioral therapy, psychological counselling, biofeedback, massage therapy, acupuncture, dry needling, and other modalities.  Such measures may reduce the need for medications. Counseling for pain management, where patients learn to function and ignore/minimize their pain, seems to work very well.   9. Recommend changing family's attention and focus away from patient's headaches. Instead, emphasize daily activities. If first question of day is 'How are your headaches/Do you have a headache today?', then patient will constantly think about headaches, thus making them worse. Goal is to re-direct attention away from headaches, toward daily activities and other distractions.   10. Helpful Websites: www.AmericanHeadacheSociety.org PatentHood.ch www.headaches.org TightMarket.nl www.achenet.org

## 2023-09-21 ENCOUNTER — Encounter: Payer: Self-pay | Admitting: Family Medicine

## 2023-09-21 ENCOUNTER — Ambulatory Visit: Payer: 59 | Admitting: Family Medicine

## 2023-09-21 VITALS — BP 134/74 | HR 68 | Ht 63.0 in | Wt 144.5 lb

## 2023-09-21 DIAGNOSIS — G43009 Migraine without aura, not intractable, without status migrainosus: Secondary | ICD-10-CM

## 2023-09-21 MED ORDER — SUMATRIPTAN SUCCINATE 100 MG PO TABS: 100.0000 mg | ORAL_TABLET | Freq: Every day | ORAL | 12 refills | Status: DC | PRN

## 2023-09-21 MED ORDER — TOPIRAMATE 50 MG PO TABS: 50.0000 mg | ORAL_TABLET | Freq: Every day | ORAL | 4 refills | Status: DC

## 2023-12-06 ENCOUNTER — Other Ambulatory Visit: Payer: Self-pay | Admitting: Family Medicine

## 2023-12-06 DIAGNOSIS — Z1231 Encounter for screening mammogram for malignant neoplasm of breast: Secondary | ICD-10-CM

## 2024-01-18 ENCOUNTER — Ambulatory Visit
Admission: RE | Admit: 2024-01-18 | Discharge: 2024-01-18 | Disposition: A | Discharge status: Home / Self Care | Payer: 59 | Source: Ambulatory Visit | Attending: Family Medicine | Admitting: Family Medicine

## 2024-01-18 DIAGNOSIS — Z1231 Encounter for screening mammogram for malignant neoplasm of breast: Secondary | ICD-10-CM

## 2024-07-05 DIAGNOSIS — R42 Dizziness and giddiness: Secondary | ICD-10-CM | POA: Insufficient documentation

## 2024-09-12 ENCOUNTER — Telehealth: Payer: Self-pay | Admitting: Family Medicine

## 2024-09-12 NOTE — Telephone Encounter (Signed)
 LVM and sent mychart msg informing pt of need to reschedule 09/24/24 appt - NP out   If patient calls back you can offer a slot with Amy on 09/25/24

## 2024-09-12 NOTE — Telephone Encounter (Signed)
 Patient rescheduled on 09/25/24 at 3:00 pm

## 2024-09-19 NOTE — Patient Instructions (Addendum)
 Below is our plan:  We will continue topiramate  50mg  daily and sumatriptan  as needed. Can add 50mg  topiramate  for 2-4 weeks to see if this helps with tension headaches. May reduce dose back to 50mg  daily if doing well. Continue sumatriptan  as needed. Consider adding an allergy medication if needed.   Please make sure you are staying well hydrated. I recommend 50-60 ounces daily. Well balanced diet and regular exercise encouraged. Consistent sleep schedule with 6-8 hours recommended.   Please continue follow up with care team as directed.   Follow up with me in 1 year   You may receive a survey regarding today's visit. I encourage you to leave honest feed back as I do use this information to improve patient care. Thank you for seeing me today!   GENERAL HEADACHE INFORMATION:   Natural supplements: Magnesium Oxide or Magnesium Glycinate 500 mg at bed (up to 800 mg daily) Coenzyme Q10 300 mg in AM Vitamin B2- 200 mg twice a day   Add 1 supplement at a time since even natural supplements can have undesirable side effects. You can sometimes buy supplements cheaper (especially Coenzyme Q10) at www.webmailguide.co.za or at [facility].  Migraine with aura: There is increased risk for stroke in women with migraine with aura and a contraindication for the combined contraceptive pill for use by women who have migraine with aura. The risk for women with migraine without aura is lower. However other risk factors like smoking are far more likely to increase stroke risk than migraine. There is a recommendation for no smoking and for the use of OCPs without estrogen such as progestogen only pills particularly for women with migraine with aura.SABRA People who have migraine headaches with auras may be 3 times more likely to have a stroke caused by a blood clot, compared to migraine patients who don't see auras. Women who take hormone-replacement therapy may be 30 percent more likely to suffer a clot-based stroke than women not  taking medication containing estrogen. Other risk factors like smoking and high blood pressure may be  much more important.    Vitamins and herbs that show potential:   Magnesium: Magnesium (250 mg twice a day or 500 mg at bed) has a relaxant effect on smooth muscles such as blood vessels. Individuals suffering from frequent or daily headache usually have low magnesium levels which can be increase with daily supplementation of 400-750 mg. Three trials found 40-90% average headache reduction  when used as a preventative. Magnesium may help with headaches are aura, the best evidence for magnesium is for migraine with aura is its thought to stop the cortical spreading depression we believe is the pathophysiology of migraine aura.Magnesium also demonstrated the benefit in menstrually related migraine.  Magnesium is part of the messenger system in the serotonin cascade and it is a good muscle relaxant.  It is also useful for constipation which can be a side effect of other medications used to treat migraine. Good sources include nuts, whole grains, and tomatoes. Side Effects: loose stool/diarrhea  Riboflavin (vitamin B 2) 200 mg twice a day. This vitamin assists nerve cells in the production of ATP a principal energy storing molecule.  It is necessary for many chemical reactions in the body.  There have been at least 3 clinical trials of riboflavin using 400 mg per day all of which suggested that migraine frequency can be decreased.  All 3 trials showed significant improvement in over half of migraine sufferers.  The supplement is found in bread,  cereal, milk, meat, and poultry.  Most Americans get more riboflavin than the recommended daily allowance, however riboflavin deficiency is not necessary for the supplements to help prevent headache. Side effects: energizing, green urine   Coenzyme Q10: This is present in almost all cells in the body and is critical component for the conversion of energy.  Recent studies  have shown that a nutritional supplement of CoQ10 can reduce the frequency of migraine attacks by improving the energy production of cells as with riboflavin.  Doses of 150 mg twice a day have been shown to be effective.   Melatonin: Increasing evidence shows correlation between melatonin secretion and headache conditions.  Melatonin supplementation has decreased headache intensity and duration.  It is widely used as a sleep aid.  Sleep is natures way of dealing with migraine.  A dose of 3 mg is recommended to start for headaches including cluster headache. Higher doses up to 15 mg has been reviewed for use in Cluster headache and have been used. The rationale behind using melatonin for cluster is that many theories regarding the cause of Cluster headache center around the disruption of the normal circadian rhythm in the brain.  This helps restore the normal circadian rhythm.   HEADACHE DIET: Foods and beverages which may trigger migraine Note that only 20% of headache patients are food sensitive. You will know if you are food sensitive if you get a headache consistently 20 minutes to 2 hours after eating a certain food. Only cut out a food if it causes headaches, otherwise you might remove foods you enjoy! What matters most for diet is to eat a well balanced healthy diet full of vegetables and low fat protein, and to not miss meals.   Chocolate, other sweets ALL cheeses except cottage and cream cheese Dairy products, yogurt, sour cream, ice cream Liver Meat extracts (Bovril, Marmite, meat tenderizers) Meats or fish which have undergone aging, fermenting, pickling or smoking. These include: Hotdogs,salami,Lox,sausage, mortadellas,smoked salmon, pepperoni, Pickled herring Pods of broad bean (English beans, Chinese pea pods, Italian (fava) beans, lima and navy beans Ripe avocado, ripe banana Yeast extracts or active yeast preparations such as Brewer's or Fleishman's (commercial bakes goods are  permitted) Tomato based foods, pizza (lasagna, etc.)   MSG (monosodium glutamate) is disguised as many things; look for these common aliases: Monopotassium glutamate Autolysed yeast Hydrolysed protein Sodium caseinate "flavorings" "all natural preservatives Nutrasweet   Avoid all other foods that convincingly provoke headaches.   Resources: The Dizzy Bluford Aid Your Headache Diet, migrainestrong.com  https://zamora-andrews.com/   Caffeine and Migraine For patients that have migraine, caffeine intake more than 3 days per week can lead to dependency and increased migraine frequency. I would recommend cutting back on your caffeine intake as best you can. The recommended amount of caffeine is 200-300 mg daily, although migraine patients may experience dependency at even lower doses. While you may notice an increase in headache temporarily, cutting back will be helpful for headaches in the long run. For more information on caffeine and migraine, visit: https://americanmigrainefoundation.org/resource-library/caffeine-and-migraine/   Headache Prevention Strategies:   1. Maintain a headache diary; learn to identify and avoid triggers.  - This can be a simple note where you log when you had a headache, associated symptoms, and medications used - There are several smartphone apps developed to help track migraines: Migraine Buddy, Migraine Monitor, Curelator N1-Headache App   Common triggers include: Emotional triggers: Emotional/Upset family or friends Emotional/Upset occupation Business reversal/success Anticipation anxiety Crisis-serious Post-crisis periodNew job/position  Physical triggers: Vacation Day Weekend Strenuous Exercise High Altitude Location New Move Menstrual Day Physical Illness Oversleep/Not enough sleep Weather changes Light: Photophobia or light sesnitivity treatment involves a balance between desensitization and  reduction in overly strong input. Use dark polarized glasses outside, but not inside. Avoid bright or fluorescent light, but do not dim environment to the point that going into a normally lit room hurts. Consider FL-41 tint lenses, which reduce the most irritating wavelengths without blocking too much light.  These can be obtained at axonoptics.com or theraspecs.com Foods: see list above.   2. Limit use of acute treatments (over-the-counter medications, triptans, etc.) to no more than 2 days per week or 10 days per month to prevent medication overuse headache (rebound headache).     3. Follow a regular schedule (including weekends and holidays): Don't skip meals. Eat a balanced diet. 8 hours of sleep nightly. Minimize stress. Exercise 30 minutes per day. Being overweight is associated with a 5 times increased risk of chronic migraine. Keep well hydrated and drink 6-8 glasses of water per day.   4. Initiate non-pharmacologic measures at the earliest onset of your headache. Rest and quiet environment. Relax and reduce stress. Breathe2Relax is a free app that can instruct you on    some simple relaxtion and breathing techniques. Http://Dawnbuse.com is a    free website that provides teaching videos on relaxation.  Also, there are  many apps that   can be downloaded for "mindful" relaxation.  An app called YOGA NIDRA will help walk you through mindfulness. Another app called Calm can be downloaded to give you a structured mindfulness guide with daily reminders and skill development. Headspace for guided meditation Mindfulness Based Stress Reduction Online Course: www.palousemindfulness.com Cold compresses.   5. Don't wait!! Take the maximum allowable dosage of prescribed medication at the first sign of migraine.   6. Compliance:  Take prescribed medication regularly as directed and at the first sign of a migraine.   7. Communicate:  Call your physician when problems arise, especially if your  headaches change, increase in frequency/severity, or become associated with neurological symptoms (weakness, numbness, slurred speech, etc.). Proceed to emergency room if you experience new or worsening symptoms or symptoms do not resolve, if you have new neurologic symptoms or if headache is severe, or for any concerning symptom.   8. Headache/pain management therapies: Consider various complementary methods, including medication, behavioral therapy, psychological counselling, biofeedback, massage therapy, acupuncture, dry needling, and other modalities.  Such measures may reduce the need for medications. Counseling for pain management, where patients learn to function and ignore/minimize their pain, seems to work very well.   9. Recommend changing family's attention and focus away from patient's headaches. Instead, emphasize daily activities. If first question of day is 'How are your headaches/Do you have a headache today?', then patient will constantly think about headaches, thus making them worse. Goal is to re-direct attention away from headaches, toward daily activities and other distractions.   10. Helpful Websites: www.AmericanHeadacheSociety.org patenthood.ch www.headaches.org tightmarket.nl www.achenet.org
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
## 2024-09-24 ENCOUNTER — Ambulatory Visit: Payer: 59 | Admitting: Family Medicine

## 2024-09-25 ENCOUNTER — Ambulatory Visit: Admitting: Family Medicine

## 2024-09-25 ENCOUNTER — Encounter: Payer: Self-pay | Admitting: Family Medicine

## 2024-09-25 VITALS — BP 122/80 | HR 65 | Ht 62.0 in | Wt 147.0 lb

## 2024-09-25 DIAGNOSIS — R55 Syncope and collapse: Secondary | ICD-10-CM | POA: Diagnosis not present

## 2024-09-25 DIAGNOSIS — R42 Dizziness and giddiness: Secondary | ICD-10-CM

## 2024-09-25 DIAGNOSIS — F5104 Psychophysiologic insomnia: Secondary | ICD-10-CM | POA: Insufficient documentation

## 2024-09-25 DIAGNOSIS — G43009 Migraine without aura, not intractable, without status migrainosus: Secondary | ICD-10-CM | POA: Diagnosis not present

## 2024-09-25 DIAGNOSIS — G44209 Tension-type headache, unspecified, not intractable: Secondary | ICD-10-CM

## 2024-09-25 DIAGNOSIS — K219 Gastro-esophageal reflux disease without esophagitis: Secondary | ICD-10-CM | POA: Insufficient documentation

## 2024-09-25 DIAGNOSIS — E782 Mixed hyperlipidemia: Secondary | ICD-10-CM | POA: Insufficient documentation

## 2024-09-25 MED ORDER — SUMATRIPTAN SUCCINATE 100 MG PO TABS: 100.0000 mg | ORAL_TABLET | Freq: Every day | ORAL | 12 refills | Status: AC | PRN

## 2024-09-25 MED ORDER — TOPIRAMATE 50 MG PO TABS: 50.0000 mg | ORAL_TABLET | Freq: Every day | ORAL | 3 refills | Status: AC

## 2024-12-17 ENCOUNTER — Other Ambulatory Visit: Payer: Self-pay | Admitting: Family Medicine

## 2024-12-17 DIAGNOSIS — Z1231 Encounter for screening mammogram for malignant neoplasm of breast: Secondary | ICD-10-CM

## 2025-01-22 ENCOUNTER — Ambulatory Visit

## 2025-10-21 ENCOUNTER — Ambulatory Visit: Admitting: Family Medicine
